# Patient Record
Sex: Male | Born: 1965 | Race: Black or African American | Hispanic: No | Marital: Married | Smoking: Never smoker
Health system: Southern US, Community
[De-identification: ages and names within clinical notes are randomized; demographics above are authoritative.]

## PROBLEM LIST (undated history)

## (undated) DIAGNOSIS — K219 Gastro-esophageal reflux disease without esophagitis: Secondary | ICD-10-CM

## (undated) DIAGNOSIS — B1910 Unspecified viral hepatitis B without hepatic coma: Secondary | ICD-10-CM

---

## 1998-01-07 ENCOUNTER — Emergency Department (HOSPITAL_COMMUNITY): Admission: EM | Admit: 1998-01-07 | Discharge: 1998-01-07 | Payer: Self-pay | Admitting: Emergency Medicine

## 1998-10-24 ENCOUNTER — Encounter: Payer: Self-pay | Admitting: Emergency Medicine

## 1998-10-24 ENCOUNTER — Emergency Department (HOSPITAL_COMMUNITY): Admission: EM | Admit: 1998-10-24 | Discharge: 1998-10-24 | Payer: Self-pay | Admitting: Emergency Medicine

## 1998-11-13 ENCOUNTER — Encounter: Payer: Self-pay | Admitting: Emergency Medicine

## 1998-11-13 ENCOUNTER — Emergency Department (HOSPITAL_COMMUNITY): Admission: EM | Admit: 1998-11-13 | Discharge: 1998-11-13 | Payer: Self-pay | Admitting: Emergency Medicine

## 1998-12-22 ENCOUNTER — Encounter: Admission: RE | Admit: 1998-12-22 | Discharge: 1998-12-22 | Payer: Self-pay | Admitting: Internal Medicine

## 1999-01-27 ENCOUNTER — Emergency Department (HOSPITAL_COMMUNITY): Admission: EM | Admit: 1999-01-27 | Discharge: 1999-01-27 | Payer: Self-pay | Admitting: Emergency Medicine

## 1999-08-22 ENCOUNTER — Emergency Department (HOSPITAL_COMMUNITY): Admission: EM | Admit: 1999-08-22 | Discharge: 1999-08-22 | Payer: Self-pay | Admitting: Emergency Medicine

## 1999-08-22 ENCOUNTER — Encounter: Payer: Self-pay | Admitting: Emergency Medicine

## 2002-02-27 ENCOUNTER — Emergency Department (HOSPITAL_COMMUNITY): Admission: EM | Admit: 2002-02-27 | Discharge: 2002-02-28 | Payer: Self-pay | Admitting: Emergency Medicine

## 2002-02-28 ENCOUNTER — Encounter: Payer: Self-pay | Admitting: Emergency Medicine

## 2002-11-25 ENCOUNTER — Encounter: Payer: Self-pay | Admitting: Emergency Medicine

## 2002-11-25 ENCOUNTER — Emergency Department (HOSPITAL_COMMUNITY): Admission: EM | Admit: 2002-11-25 | Discharge: 2002-11-25 | Payer: Self-pay | Admitting: Emergency Medicine

## 2005-12-06 ENCOUNTER — Ambulatory Visit: Payer: Self-pay | Admitting: Family Medicine

## 2006-01-19 ENCOUNTER — Ambulatory Visit: Payer: Self-pay | Admitting: Family Medicine

## 2006-01-20 ENCOUNTER — Ambulatory Visit: Payer: Self-pay | Admitting: Family Medicine

## 2006-05-04 ENCOUNTER — Ambulatory Visit: Payer: Self-pay | Admitting: Family Medicine

## 2006-05-06 ENCOUNTER — Ambulatory Visit: Payer: Self-pay | Admitting: Family Medicine

## 2006-07-08 ENCOUNTER — Ambulatory Visit: Payer: Self-pay | Admitting: Internal Medicine

## 2006-09-08 ENCOUNTER — Ambulatory Visit: Payer: Self-pay | Admitting: Gastroenterology

## 2008-11-04 ENCOUNTER — Emergency Department (HOSPITAL_COMMUNITY): Admission: EM | Admit: 2008-11-04 | Discharge: 2008-11-04 | Payer: Self-pay | Admitting: Emergency Medicine

## 2009-05-14 ENCOUNTER — Emergency Department (HOSPITAL_COMMUNITY): Admission: EM | Admit: 2009-05-14 | Discharge: 2009-05-14 | Payer: Self-pay | Admitting: Emergency Medicine

## 2009-05-28 ENCOUNTER — Emergency Department (HOSPITAL_COMMUNITY): Admission: EM | Admit: 2009-05-28 | Discharge: 2009-05-28 | Payer: Self-pay | Admitting: Emergency Medicine

## 2009-08-04 ENCOUNTER — Emergency Department (HOSPITAL_COMMUNITY): Admission: EM | Admit: 2009-08-04 | Discharge: 2009-08-04 | Payer: Self-pay | Admitting: Family Medicine

## 2009-09-24 ENCOUNTER — Ambulatory Visit: Payer: Self-pay | Admitting: Internal Medicine

## 2009-10-13 ENCOUNTER — Ambulatory Visit (HOSPITAL_COMMUNITY): Admission: RE | Admit: 2009-10-13 | Discharge: 2009-10-13 | Payer: Self-pay | Admitting: Surgery

## 2009-12-09 ENCOUNTER — Ambulatory Visit: Payer: Self-pay | Admitting: Family Medicine

## 2009-12-09 LAB — CONVERTED CEMR LAB
Cholesterol: 146 mg/dL (ref 0–200)
HDL: 33 mg/dL — ABNORMAL LOW (ref >39)
LDL Cholesterol: 98 mg/dL (ref 0–99)
Total CHOL/HDL Ratio: 4.4
Triglycerides: 74 mg/dL (ref <150)
VLDL: 15 mg/dL (ref 0–40)

## 2009-12-19 ENCOUNTER — Emergency Department (HOSPITAL_COMMUNITY): Admission: EM | Admit: 2009-12-19 | Discharge: 2009-12-19 | Payer: Self-pay | Admitting: Family Medicine

## 2010-01-20 ENCOUNTER — Ambulatory Visit: Payer: Self-pay | Admitting: Internal Medicine

## 2010-03-06 ENCOUNTER — Emergency Department (HOSPITAL_COMMUNITY): Admission: EM | Admit: 2010-03-06 | Discharge: 2010-03-06 | Payer: Self-pay | Admitting: Emergency Medicine

## 2010-05-29 ENCOUNTER — Emergency Department (HOSPITAL_COMMUNITY): Admission: EM | Admit: 2010-05-29 | Discharge: 2010-05-29 | Payer: Self-pay | Admitting: Emergency Medicine

## 2010-06-03 ENCOUNTER — Ambulatory Visit: Payer: Self-pay | Admitting: Family Medicine

## 2010-11-05 ENCOUNTER — Inpatient Hospital Stay (INDEPENDENT_AMBULATORY_CARE_PROVIDER_SITE_OTHER)
Admission: RE | Admit: 2010-11-05 | Discharge: 2010-11-05 | Disposition: A | Discharge status: Home / Self Care | Payer: Self-pay | Source: Ambulatory Visit | Attending: Family Medicine | Admitting: Family Medicine

## 2010-11-05 DIAGNOSIS — N478 Other disorders of prepuce: Secondary | ICD-10-CM

## 2010-12-10 LAB — POCT URINALYSIS DIPSTICK
Hgb urine dipstick: NEGATIVE
Protein, ur: NEGATIVE mg/dL
Specific Gravity, Urine: 1.01 (ref 1.005–1.030)
Urobilinogen, UA: 0.2 mg/dL (ref 0.0–1.0)
pH: 7 (ref 5.0–8.0)

## 2010-12-10 LAB — POCT I-STAT, CHEM 8
BUN: 5 mg/dL — ABNORMAL LOW (ref 6–23)
Calcium, Ion: 1.02 mmol/L — ABNORMAL LOW (ref 1.12–1.32)
Chloride: 109 mEq/L (ref 96–112)
Creatinine, Ser: 1.1 mg/dL (ref 0.4–1.5)
Glucose, Bld: 91 mg/dL (ref 70–99)
HCT: 46 % (ref 39.0–52.0)

## 2010-12-10 LAB — CBC
HCT: 43.1 % (ref 39.0–52.0)
MCHC: 35.3 g/dL (ref 30.0–36.0)
MCV: 83 fL (ref 78.0–100.0)
RDW: 13.1 % (ref 11.5–15.5)
WBC: 5.5 10*3/uL (ref 4.0–10.5)

## 2010-12-10 LAB — DIFFERENTIAL
Basophils Absolute: 0 10*3/uL (ref 0.0–0.1)
Lymphs Abs: 1.4 10*3/uL (ref 0.7–4.0)
Monocytes Relative: 17 % — ABNORMAL HIGH (ref 3–12)
Neutro Abs: 3.1 10*3/uL (ref 1.7–7.7)

## 2011-01-01 LAB — CULTURE, ROUTINE-ABSCESS

## 2011-02-12 NOTE — Assessment & Plan Note (Signed)
[facility]                         GASTROENTEROLOGY OFFICE NOTE   THEDORE, PICKEL                         MRN:          [id number]  DATE:09/08/2006                            DOB:          01-21-1966    REFERRING PHYSICIAN:  Dr. Loreen Freud.   REASON FOR REFERRAL:  Rectal bleeding and reflux symptoms.   HISTORY OF PRESENT ILLNESS:  Kurt Ross is a 45 year old African male whom  I have seen in the past.  He is originally from [place] and moved to the  [place] about 8 years ago. There is some language barrier which interfers  with historical and symptom details. He has a history of GERD with cough  and reactive airway disease.  He underwent upper endoscopy with an  empiric Maloney dilation in January of 2001 that showed only grade I  distal esophagitis.  He has been evaluated by Dr. Jayme Cloud for his cough  and reactive airway disease.  He sees Dr. Loreen Freud as his primary  physician.  He relates ongoing problems with belching, regurgitation,  and heartburn.  He has occasional problems with mild constipation and  notes small amounts of rectal bleeding and a small external lesion on  his rectum for over one year.  He was seen at [facility] in August of  2006 and per records no rectal lesions were noted, but Hemoccult  positive stool was noted.  He was recommended to have GI F/U at that  time. He states he has subsequently seen Dr. Laury Axon, who has noted an  external hemorrhoid.  A positive Helicobacter pylori antibody was  obtained in August of 2006, and was treated with Helidac for 14 days.  Liver function tests and a CBC were normal in August of 2006.  He notes  no change in stool caliber, diarrhea, weight loss, abdominal pain,  nausea or vomiting.  He does note a history of swallowing difficulties  in the past, but none in the past few years.  He is not currently taking  any acid suppressants or any other gastrointestinal medications.   PAST MEDICAL  HISTORY:  GERD, rhinitis, reactive airway disease,  arthritis, depression.   CURRENT MEDICATIONS:  Flonase nasal spray daily.   MEDICATION ALLERGIES:  None known.   SOCIAL HISTORY AND REVIEW OF SYSTEMS:  Per the hand-written form.   PHYSICAL EXAM:  In no acute distress.  Weight 221 pounds, blood pressure 104/70, pulse 88 and regular.  HEENT:  Anicteric sclerae, oropharynx clear.  CHEST:  Clear to auscultation bilaterally.  CARDIAC:  Regular rate and rhythm without murmurs appreciated.  ABDOMEN:  Soft and nontender.  Nondistended.  Normoactive bowel sounds.  No palpable organomegaly, masses, or hernias.  RECTAL:  Deferred to the time of colonoscopy.  EXTREMITIES:  Without cyanosis, clubbing, or edema.  NEUROLOGIC:  Alert and oriented x3.  Grossly nonfocal.   ASSESSMENT AND PLAN:  1. Chronic gastroesophageal reflux disease with a history of reactive      airway disease.  Begin omeprazole 20 mg p.o. q. a.m. along with      standard antireflux measures for long-term  usage.  2. Hemoccult positive stool, intermittent small volume hematochezia,      presumed ext. hemorrhoids and intermittent constipation.  Risks,      benefits, and alternatives to colonoscopy with possible biopsy,      possible polypectomy, and possible destruction of internal      hemorrhoids are discussed with the patient.  He consents to proceed      and this will be scheduled electively.     Venita Lick. Russella Dar, MD, [facility]  Electronically Signed    MTS/MedQ  DD: 09/08/2006  DT: 09/08/2006  Job #: [id number]   cc:   Loreen Freud, M.D.

## 2012-02-25 ENCOUNTER — Ambulatory Visit: Payer: Self-pay | Admitting: Family Medicine

## 2012-02-25 VITALS — BP 126/72 | HR 64 | Temp 98.3°F | Resp 16 | Ht 71.0 in | Wt 215.2 lb

## 2012-02-25 DIAGNOSIS — K219 Gastro-esophageal reflux disease without esophagitis: Secondary | ICD-10-CM

## 2012-02-25 DIAGNOSIS — R0602 Shortness of breath: Secondary | ICD-10-CM

## 2012-02-25 DIAGNOSIS — R059 Cough, unspecified: Secondary | ICD-10-CM

## 2012-02-25 DIAGNOSIS — R197 Diarrhea, unspecified: Secondary | ICD-10-CM

## 2012-02-25 DIAGNOSIS — R05 Cough: Secondary | ICD-10-CM

## 2012-02-25 LAB — POCT CBC
HCT, POC: 46.6 % (ref 43.5–53.7)
Hemoglobin: 15.1 g/dL (ref 14.1–18.1)
Lymph, poc: 1.7 (ref 0.6–3.4)
MCHC: 32.4 g/dL (ref 31.8–35.4)
MCV: 87.4 fL (ref 80–97)
POC LYMPH PERCENT: 46.3 %L (ref 10–50)
RDW, POC: 14.4 %
WBC: 3.7 10*3/uL — AB (ref 4.6–10.2)

## 2012-02-25 MED ORDER — METOCLOPRAMIDE HCL 5 MG PO TABS: 5.0000 mg | ORAL_TABLET | Freq: Four times a day (QID) | ORAL | Status: DC

## 2012-02-25 MED ORDER — OMEPRAZOLE 40 MG PO CPDR: 40.0000 mg | DELAYED_RELEASE_CAPSULE | Freq: Every day | ORAL | Status: DC

## 2012-02-25 NOTE — Progress Notes (Signed)
Subjective: 46 year old African male from [place]. He has a long history of reflux over the last 20 years. He has tried numerous medications without relief. He has been evaluated by gastroenterologist a long time ago. He does have concerns because his continues on so much. 2 days ago he developed a couple of episodes of diarrhea. Then he developed some throat discomfort and reflux. The reflux is been worse with the burping a lot. He got coughing and getting short of breath last night. He called the ambulance, but they assessed him and did not find anything of major concern at that time, feeling like he is from the drainage and and reflux. He has felt like he might have a little fever yesterday. The diarrhea has not persisted. No one else in the family has been acutely ill recently. 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  Objective: No major distress but he does not look like he feels real well. He is TMs are normal though partially occluded by cerumen. His throat was clear. Neck supple without significant nodes. Chest is clear to auscultation. Heart regular without murmurs gallops or arrhythmias. Abdomen soft without masses or tenderness.  Assessment: GERD Diarrhea Cough and shortness of breath  Plan: Check CBC and H. Pylori. Results for orders placed in visit on 02/25/12  POCT CBC      Component Value Range   WBC 3.7 (*) 4.6 - 10.2 (K/uL)   Lymph, poc 1.7  0.6 - 3.4    POC LYMPH PERCENT 46.3  10 - 50 (%L)   MID (cbc) 0.5  0 - 0.9    POC MID % 12.4 (*) 0 - 12 (%M)   POC Granulocyte 1.5 (*) 2 - 6.9    Granulocyte percent 41.3  37 - 80 (%G)   RBC 5.33  4.69 - 6.13 (M/uL)   Hemoglobin 15.1  14.1 - 18.1 (g/dL)   HCT, POC 16.1  09.6 - 53.7 (%)   MCV 87.4  80 - 97 (fL)   MCH, POC 28.3  27 - 31.2 (pg)   MCHC 32.4  31.8 - 35.4 (g/dL)   RDW, POC 04.5     Platelet Count, POC 221  142 - 424 (K/uL)   MPV 9.3  0 - 99.8 (fL)   Will treat with Reglan because he is so nauseated and omeprazole. If he  keeps having problems we will have her referred to a gastroenterologist, but we talked about the fact he does not have any insurance.

## 2012-02-25 NOTE — Patient Instructions (Signed)

## 2012-02-29 ENCOUNTER — Telehealth: Payer: Self-pay

## 2012-02-29 ENCOUNTER — Encounter: Payer: Self-pay | Admitting: Family Medicine

## 2012-02-29 NOTE — Telephone Encounter (Signed)
Has developed a rash and cannot sleep from the medication [phone]

## 2012-03-02 NOTE — Telephone Encounter (Signed)
Pt reports that the rash was only once and it went away even w/continuing the meds. He does not think it was related, but he is having trouble staying awake at night w/the Reglan - he gets sleepy and goes to sleep but wakes up after two hrs and can't go back to sleep. Advised pt that he can try DCing the Reglan since Dr Alwyn Ren only Rxd it for a short time, and CB if his Sxs worsen again. Pt asked if he can take Benedryl at night with the other meds. After checking w/Alicia, told pt that the Reglan and Benedryl together may just make him extra drowsy/sleepy and it would be best to take the Reglan about 4 hrs earlier than the Benedryl instead of together. Pt agreed.

## 2012-03-02 NOTE — Telephone Encounter (Signed)
This is Dr. Frederik Pear pt but  was in [facility]'s in basket.  Please have pt stop meds and RTC to see DR. Alwyn Ren

## 2012-10-23 ENCOUNTER — Emergency Department (HOSPITAL_COMMUNITY)
Admission: EM | Admit: 2012-10-23 | Discharge: 2012-10-23 | Disposition: A | Discharge status: Home / Self Care | Payer: No Typology Code available for payment source | Source: Home / Self Care | Attending: Family Medicine | Admitting: Family Medicine

## 2012-10-23 ENCOUNTER — Other Ambulatory Visit (HOSPITAL_COMMUNITY)
Admission: RE | Admit: 2012-10-23 | Discharge: 2012-10-23 | Disposition: A | Discharge status: Home / Self Care | Payer: No Typology Code available for payment source | Source: Ambulatory Visit | Attending: Family Medicine | Admitting: Family Medicine

## 2012-10-23 ENCOUNTER — Encounter (HOSPITAL_COMMUNITY): Payer: Self-pay | Admitting: Emergency Medicine

## 2012-10-23 DIAGNOSIS — Z113 Encounter for screening for infections with a predominantly sexual mode of transmission: Secondary | ICD-10-CM | POA: Insufficient documentation

## 2012-10-23 DIAGNOSIS — I861 Scrotal varices: Secondary | ICD-10-CM

## 2012-10-23 DIAGNOSIS — R351 Nocturia: Secondary | ICD-10-CM

## 2012-10-23 HISTORY — DX: Gastro-esophageal reflux disease without esophagitis: K21.9

## 2012-10-23 LAB — POCT URINALYSIS DIP (DEVICE)
Hgb urine dipstick: NEGATIVE
Ketones, ur: NEGATIVE mg/dL
Protein, ur: NEGATIVE mg/dL
Specific Gravity, Urine: 1.015 (ref 1.005–1.030)
Urobilinogen, UA: 0.2 mg/dL (ref 0.0–1.0)

## 2012-10-23 MED ORDER — TAMSULOSIN HCL 0.4 MG PO CAPS: 0.4000 mg | ORAL_CAPSULE | Freq: Every day | ORAL | Status: DC

## 2012-10-23 MED ORDER — DOXYCYCLINE HYCLATE 100 MG PO CAPS: 100.0000 mg | ORAL_CAPSULE | Freq: Two times a day (BID) | ORAL | Status: DC

## 2012-10-23 NOTE — ED Provider Notes (Signed)
History     CSN: [id number]  Arrival date & time 10/23/12  1819   First MD Initiated Contact with Patient 10/23/12 2035      Chief Complaint  Patient presents with  . Prostate Cancer    (Consider location/radiation/quality/duration/timing/severity/associated sxs/prior treatment) The history is provided by the patient.  47 y.o. male complains of bloody semen for three months.  States history of same three years ago for which he had a urological exam with no abnormalities.  States he was seen by primary care provider this month and was told that if symptoms did not improve he would ultrasound and further evaluation.  Complains of dysuria, frequent urination and nocturnal frequency.  Additionally reports problems with remaining erect with sexual intercourse.  Denies penile discharge, no concern for STD but expresses concern for prostate cancer since his father was diagnosed with prostatic "problems" at age of 74.  Denies significant pelvic pain or fever. Last unprotected intercourse 1 month ago.  Denies history of known exposure to STD or symptoms in partner.   Past Medical History  Diagnosis Date  . GERD (gastroesophageal reflux disease)     History reviewed. No pertinent past surgical history.  No family history on file.  History  Substance Use Topics  . Smoking status: Never Smoker   . Smokeless tobacco: Not on file  . Alcohol Use: No      Review of Systems  Genitourinary: Positive for dysuria, frequency and scrotal swelling.  All other systems reviewed and are negative.    Allergies  Codeine  Home Medications   Current Outpatient Rx  Name  Route  Sig  Dispense  Refill  . DOXYCYCLINE HYCLATE 100 MG PO CAPS   Oral   Take 1 capsule (100 mg total) by mouth 2 (two) times daily.   20 capsule   0   . METOCLOPRAMIDE HCL 5 MG PO TABS   Oral   Take 1 tablet (5 mg total) by mouth 4 (four) times daily.   40 tablet   0   . OMEPRAZOLE 40 MG PO CPDR   Oral   Take  1 capsule (40 mg total) by mouth daily.   30 capsule   3   . TAMSULOSIN HCL 0.4 MG PO CAPS   Oral   Take 1 capsule (0.4 mg total) by mouth daily after supper.   30 capsule   0     BP 138/80  Pulse 62  Temp 98.5 F (36.9 C) (Oral)  Resp 16  SpO2 98%  Physical Exam  Nursing note and vitals reviewed. Constitutional: He is oriented to person, place, and time. Vital signs are normal. He appears well-developed and well-nourished. He is active and cooperative.  HENT:  Head: Normocephalic.  Eyes: Conjunctivae normal are normal. Pupils are equal, round, and reactive to light. No scleral icterus.  Neck: Trachea normal. Neck supple.  Cardiovascular: Normal rate and regular rhythm.   Pulmonary/Chest: Effort normal and breath sounds normal.  Abdominal: Soft. Bowel sounds are normal. There is no tenderness. There is no rebound and no guarding. Hernia confirmed negative in the right inguinal area and confirmed negative in the left inguinal area.  Genitourinary: Rectum normal, prostate normal and penis normal. Cremasteric reflex is present. Right testis shows swelling. Right testis shows no mass and no tenderness. Right testis is descended. Cremasteric reflex is not absent on the right side. Left testis shows no mass, no swelling and no tenderness. Left testis is descended. Cremasteric reflex is not absent  on the left side. Circumcised.       Left varicocele  Lymphadenopathy:       Right: No inguinal adenopathy present.       Left: No inguinal adenopathy present.  Neurological: He is alert and oriented to person, place, and time. No cranial nerve deficit or sensory deficit.  Skin: Skin is warm and dry.  Psychiatric: He has a normal mood and affect. His speech is normal and behavior is normal. Judgment and thought content normal. Cognition and memory are normal.    ED Course  Procedures (including critical care time)   Labs Reviewed  POCT URINALYSIS DIP (DEVICE)  URINE CYTOLOGY ANCILLARY  ONLY  URINE CULTURE   No results found.   1. Left varicocele   2. Nocturnal polyuria       MDM  Symptoms associated with what I believe to be prostatic hypertrophy although not effectively palpated on rectal exam.  Varicocele found on exam, nontender to palpation.  Will await urine culture, STD screen.  Doxycycline prescribed prophylaxis.  Pt referred to urologist.        Johnsie Kindred, NP 10/23/12 2156

## 2012-10-23 NOTE — ED Notes (Addendum)
Reports blood in semen, denies blood in urine.  Has had episodes for three years, this episode has been 2 months .  Has seen specialist, but no diagnosis per patient.  Episode resolves on its own.  This episode has not cleared like previous episodes.  Patient is concerned for prostate cancer

## 2012-10-24 NOTE — ED Provider Notes (Signed)
Medical screening examination/treatment/procedure(s) were performed by resident physician or non-physician practitioner and as supervising physician I was immediately available for consultation/collaboration.   Barkley Bruns MD.    Linna Hoff, MD 10/24/12 Zollie Pee

## 2012-11-06 ENCOUNTER — Emergency Department (HOSPITAL_COMMUNITY)
Admission: EM | Admit: 2012-11-06 | Discharge: 2012-11-06 | Disposition: A | Discharge status: Home / Self Care | Payer: No Typology Code available for payment source | Source: Home / Self Care | Attending: Family Medicine | Admitting: Family Medicine

## 2012-11-06 ENCOUNTER — Encounter (HOSPITAL_COMMUNITY): Payer: Self-pay

## 2012-11-06 DIAGNOSIS — I861 Scrotal varices: Secondary | ICD-10-CM

## 2012-11-06 DIAGNOSIS — R361 Hematospermia: Secondary | ICD-10-CM

## 2012-11-06 DIAGNOSIS — N400 Enlarged prostate without lower urinary tract symptoms: Secondary | ICD-10-CM

## 2012-11-06 NOTE — ED Notes (Signed)
Patient states was seen  By urgent care            Dx- varicocele and needs a referral to [place], Lowella Petties, MD [address] [place] [address]  [place], [place] [address]

## 2012-11-06 NOTE — ED Provider Notes (Signed)
History     CSN: [id number]  Arrival date & time 11/06/12  1655   First MD Initiated Contact with Patient 11/06/12 1701      Chief Complaint  Patient presents with  . Follow-up   HPI Pt is presenting today to request a referral to a urologist for evaluation of blood in semen that has been present for about 3 months.  He says that he had this problem before and he has seen a urologist in the past but it went away.  For some reason, the problem has returned and it has not gone away.  The patient reports that he insists that he see a urologist.  No pain with ejaculation.  He says occasionally he has burning with urination.  He was recently seen and started on doxycycline by the urgent care provider.  No significant improvements noted.  Pt says that he was placed on an antibiotic doxycycline when he was seen at urgent care recently but no improvement in symptoms.  He was diagnosed with a left varicocele at that visit.  He was also diagnosed with mild BPH.   Past Medical History  Diagnosis Date  . GERD (gastroesophageal reflux disease)     History reviewed. No pertinent past surgical history.  No family history on file.  History  Substance Use Topics  . Smoking status: Never Smoker   . Smokeless tobacco: Not on file  . Alcohol Use: No    Review of Systems  Genitourinary: Positive for dysuria, urgency and frequency.       Blood in semen   All other systems reviewed and are negative.    Allergies  Codeine  Home Medications   Current Outpatient Rx  Name  Route  Sig  Dispense  Refill  . doxycycline (VIBRAMYCIN) 100 MG capsule   Oral   Take 1 capsule (100 mg total) by mouth 2 (two) times daily.   20 capsule   0   . EXPIRED: metoCLOPramide (REGLAN) 5 MG tablet   Oral   Take 1 tablet (5 mg total) by mouth 4 (four) times daily.   40 tablet   0   . omeprazole (PRILOSEC) 40 MG capsule   Oral   Take 1 capsule (40 mg total) by mouth daily.   30 capsule   3   .  Tamsulosin HCl (FLOMAX) 0.4 MG CAPS   Oral   Take 1 capsule (0.4 mg total) by mouth daily after supper.   30 capsule   0     There were no vitals taken for this visit.  Physical Exam  Nursing note and vitals reviewed. Constitutional: He is oriented to person, place, and time. He appears well-developed and well-nourished. No distress.  HENT:  Head: Normocephalic and atraumatic.  Eyes: Conjunctivae and EOM are normal. Pupils are equal, round, and reactive to light.  Neck: Normal range of motion. Neck supple.  Cardiovascular: Normal rate.   Pulmonary/Chest: Effort normal and breath sounds normal.  Musculoskeletal: Normal range of motion. He exhibits no edema and no tenderness.  Neurological: He is alert and oriented to person, place, and time.  Skin: Skin is warm and dry.  Psychiatric: He has a normal mood and affect. His behavior is normal. Judgment and thought content normal.    ED Course  Procedures (including critical care time)  Labs Reviewed - No data to display No results found.   No diagnosis found.  MDM  IMPRESSION  Varicocele, left  Blood in semen  BPH  RECOMMENDATIONS / PLAN Pt was recommended to see Dr. Lowella Petties [place] by the urgent care center.  He has an [employer] discount card.  Will go ahead and make referral to the urologist.    FOLLOW UP 2 months  The patient was given clear instructions to go to ER or return to medical center if symptoms don't improve, worsen or new problems develop.  The patient verbalized understanding.  The patient was told to call to get lab results if they haven't heard anything in the next week.            Cleora Fleet, MD 11/06/12 [phone]

## 2012-11-07 NOTE — ED Notes (Signed)
Referral faxed to urology waiting for appt

## 2012-12-21 NOTE — ED Notes (Signed)
Pt was already seen by specialist- no need for urologic evaluation

## 2013-02-08 ENCOUNTER — Encounter (HOSPITAL_COMMUNITY): Payer: Self-pay

## 2013-02-08 ENCOUNTER — Emergency Department (HOSPITAL_COMMUNITY): Payer: No Typology Code available for payment source

## 2013-02-08 ENCOUNTER — Emergency Department (HOSPITAL_COMMUNITY)
Admission: EM | Admit: 2013-02-08 | Discharge: 2013-02-08 | Disposition: A | Discharge status: Other Acute Inpatient Hospital | Payer: No Typology Code available for payment source | Source: Home / Self Care

## 2013-02-08 ENCOUNTER — Emergency Department (HOSPITAL_COMMUNITY)
Admission: EM | Admit: 2013-02-08 | Discharge: 2013-02-08 | Disposition: A | Discharge status: Home / Self Care | Payer: No Typology Code available for payment source | Attending: Emergency Medicine | Admitting: Emergency Medicine

## 2013-02-08 DIAGNOSIS — R748 Abnormal levels of other serum enzymes: Secondary | ICD-10-CM | POA: Insufficient documentation

## 2013-02-08 DIAGNOSIS — R1011 Right upper quadrant pain: Secondary | ICD-10-CM

## 2013-02-08 DIAGNOSIS — Z880 Allergy status to penicillin: Secondary | ICD-10-CM | POA: Insufficient documentation

## 2013-02-08 DIAGNOSIS — Z79899 Other long term (current) drug therapy: Secondary | ICD-10-CM | POA: Insufficient documentation

## 2013-02-08 DIAGNOSIS — K219 Gastro-esophageal reflux disease without esophagitis: Secondary | ICD-10-CM | POA: Insufficient documentation

## 2013-02-08 DIAGNOSIS — R52 Pain, unspecified: Secondary | ICD-10-CM

## 2013-02-08 LAB — URINALYSIS, ROUTINE W REFLEX MICROSCOPIC
Ketones, ur: NEGATIVE mg/dL
Leukocytes, UA: NEGATIVE
Nitrite: NEGATIVE
Protein, ur: NEGATIVE mg/dL
Urobilinogen, UA: 0.2 mg/dL (ref 0.0–1.0)

## 2013-02-08 LAB — COMPREHENSIVE METABOLIC PANEL
ALT: 82 U/L — ABNORMAL HIGH (ref 0–53)
CO2: 30 mEq/L (ref 19–32)
Calcium: 9.4 mg/dL (ref 8.4–10.5)
Creatinine, Ser: 0.99 mg/dL (ref 0.50–1.35)
GFR calc Af Amer: >90 mL/min (ref >90)
GFR calc non Af Amer: >90 mL/min (ref >90)
Glucose, Bld: 84 mg/dL (ref 70–99)
Sodium: 139 mEq/L (ref 135–145)
Total Protein: 8.2 g/dL (ref 6.0–8.3)

## 2013-02-08 LAB — CBC WITH DIFFERENTIAL/PLATELET
Eosinophils Absolute: 0.1 10*3/uL (ref 0.0–0.7)
Eosinophils Relative: 3 % (ref 0–5)
HCT: 46.9 % (ref 39.0–52.0)
Lymphs Abs: 2 10*3/uL (ref 0.7–4.0)
MCH: 30.2 pg (ref 26.0–34.0)
MCV: 83.5 fL (ref 78.0–100.0)
Monocytes Absolute: 0.6 10*3/uL (ref 0.1–1.0)
Platelets: 184 10*3/uL (ref 150–400)
RBC: 5.62 MIL/uL (ref 4.22–5.81)
RDW: 13.2 % (ref 11.5–15.5)

## 2013-02-08 LAB — LIPASE, BLOOD: Lipase: 31 U/L (ref 11–59)

## 2013-02-08 LAB — AMYLASE: Amylase: 80 U/L (ref 0–105)

## 2013-02-08 MED ORDER — TRAMADOL HCL 50 MG PO TABS: 50.0000 mg | ORAL_TABLET | Freq: Four times a day (QID) | ORAL | Status: DC | PRN

## 2013-02-08 MED ORDER — DEXLANSOPRAZOLE 30 MG PO CPDR: 30.0000 mg | DELAYED_RELEASE_CAPSULE | Freq: Every day | ORAL | Status: DC

## 2013-02-08 NOTE — ED Notes (Signed)
Pt. Having rt. Upper abdominal pain, Urgent care sent pt. To [place] for an US of the abdomen.  Pt. Denies any n/v/d

## 2013-02-08 NOTE — ED Provider Notes (Addendum)
History     CSN: [id number]  Arrival date & time 02/08/13  1340   First MD Initiated Contact with Patient 02/08/13 1500      Chief Complaint  Patient presents with  . Abdominal Pain    (Consider location/radiation/quality/duration/timing/severity/associated sxs/prior treatment) Patient is a 47 y.o. male presenting with abdominal pain.  Abdominal Pain Associated symptoms: constipation and nausea     Patient is a 47 year old [place] American male with history of GERD, has been on a PPI in the past but none now. Per patient he had EGD in the past which had shown PUD he was on H. pylori treatment and PPI, then he did not followup with GI. This morning after he ate his breakfast, developed sharp, right upper quadrant pain, 10/10, intermittent which spontaneously resolved in 1and half hour. At the time of my examination, patient had no pain. He denies any prior episodes of such pain.  Patient states that he sometimes gets abdominal pain when he is constipated however he had a bowel movement today morning.   Past Medical History  Diagnosis Date  . GERD (gastroesophageal reflux disease)     History reviewed. No pertinent past surgical history.  History reviewed. No pertinent family history.  History  Substance Use Topics  . Smoking status: Never Smoker   . Smokeless tobacco: Not on file  . Alcohol Use: No      Review of Systems  Constitutional: Negative.   HENT: Negative.   Eyes: Negative.   Respiratory: Negative.   Cardiovascular: Negative.   Gastrointestinal: Positive for nausea, abdominal pain and constipation.  Endocrine: Negative.   Genitourinary: Negative.   Musculoskeletal: Negative.   Skin: Negative.   Neurological: Negative.     Allergies  Codeine  Home Medications   Current Outpatient Rx  Name  Route  Sig  Dispense  Refill  . Dexlansoprazole (DEXILANT) 30 MG capsule   Oral   Take 1 capsule (30 mg total) by mouth daily.   30 capsule   3   .  doxycycline (VIBRAMYCIN) 100 MG capsule   Oral   Take 1 capsule (100 mg total) by mouth 2 (two) times daily.   20 capsule   0   . EXPIRED: metoCLOPramide (REGLAN) 5 MG tablet   Oral   Take 1 tablet (5 mg total) by mouth 4 (four) times daily.   40 tablet   0   . omeprazole (PRILOSEC) 40 MG capsule   Oral   Take 1 capsule (40 mg total) by mouth daily.   30 capsule   3   . Tamsulosin HCl (FLOMAX) 0.4 MG CAPS   Oral   Take 1 capsule (0.4 mg total) by mouth daily after supper.   30 capsule   0   . traMADol (ULTRAM) 50 MG tablet   Oral   Take 1 tablet (50 mg total) by mouth every 6 (six) hours as needed for pain.   30 tablet   3     BP 114/68  Pulse 61  Temp(Src) 97.6 F (36.4 C) (Oral)  Resp 16  SpO2 100%  Physical Exam  Constitutional: He is oriented to person, place, and time. He appears well-developed and well-nourished.  HENT:  Head: Normocephalic and atraumatic.  Eyes: Pupils are equal, round, and reactive to light.  Cardiovascular: Normal rate and regular rhythm.   Pulmonary/Chest: Effort normal and breath sounds normal.  Abdominal: Soft. Bowel sounds are normal. There is no tenderness. There is no rebound and no guarding.  Neurological: He is alert and oriented to person, place, and time.  Skin: Skin is warm and dry.  Psychiatric: He has a normal mood and affect. His behavior is normal.    ED Course  Procedures (including critical care time)  Labs Reviewed - No data to display No results found.   1. Abdominal pain, acute, right upper quadrant   2. GERD (gastroesophageal reflux disease)       MDM  1)  Abdominal pain- currently resolved, started after eating breakfast - Patient advised to eat fat free diet, n.p.o. after midnight for the ultrasound at 7 AM - Abdominal US ordered for tomorrow a.m. to rule out any cholelithiasis/acute cholecystitis/biliary disease -Will followup results with the patient in am, gave prescription for tramadol PRN for  pain  2) GERD - Given prescription forDexilant  Taneil Lazarus M.D. [facility] 02/08/2013, 3:41 PM  Pager: [phone]   Addendum The patient again started having severe right upper quadrant abdominal pain while still in the room awaiting disposition. On examination appears to be have possibly biliary colic intermittent pain. No nausea, vomiting or fevers. However at this point he needs to have further workup. He denies any alcohol use. Will transfer him to the [facility] emergency room for further workup, abdominal ultrasound, LFTs, lipase. Discussed with the patient in detail, he is agreeable with the current plan.   Normajean Nash M.D. [facility] 02/08/2013, 4:02 PM  Pager: [phone]       Cathren Harsh, MD 02/08/13 [phone]

## 2013-02-08 NOTE — ED Notes (Signed)
Reports onset today of pain right upper abdominial area , right lower ribs; denies n/d/v, denies dysuria, hematuria; no one else in home ill

## 2013-02-08 NOTE — ED Notes (Signed)
Patient has had recurrence of previous pain ; MD advised

## 2013-02-08 NOTE — ED Notes (Signed)
Pt transported to US

## 2013-02-08 NOTE — ED Provider Notes (Signed)
History     CSN: [id number]  Arrival date & time 02/08/13  1613   First MD Initiated Contact with Patient 02/08/13 1809      Chief Complaint  Patient presents with  . Abdominal Pain    (Consider location/radiation/quality/duration/timing/severity/associated sxs/prior treatment) HPI Comments: Patient presents to the ED for intermittent RUQ pain.  Pain described as sharp, non-radiating and is not associated with nausea or vomiting.  Pain is not exacerbated by intake of greasy or spicy foods, however he notes that this causes his GERD sx to worsen.  Notes if he does not eat for several hours, he tends to get a bloating sensation throughout his abdomen.  Patient seen by his PCP earlier today for the same, sent to the ED for labs and abdominal ultrasound.  Denies any nausea, vomiting, diarrhea, fever, sweats or chills. Bowel movements normal including one earlier today. Denies blood in his stool.   Patient currently denies any pain.  The history is provided by the patient.    Past Medical History  Diagnosis Date  . GERD (gastroesophageal reflux disease)     History reviewed. No pertinent past surgical history.  No family history on file.  History  Substance Use Topics  . Smoking status: Never Smoker   . Smokeless tobacco: Not on file  . Alcohol Use: No      Review of Systems  Gastrointestinal: Positive for abdominal pain.  All other systems reviewed and are negative.    Allergies  Codeine and Penicillins  Home Medications   Current Outpatient Rx  Name  Route  Sig  Dispense  Refill  . OVER THE COUNTER MEDICATION   Oral   Take 2 tablets by mouth every evening. Arthritis medicine         . Dexlansoprazole (DEXILANT) 30 MG capsule   Oral   Take 1 capsule (30 mg total) by mouth daily.   30 capsule   3   . traMADol (ULTRAM) 50 MG tablet   Oral   Take 1 tablet (50 mg total) by mouth every 6 (six) hours as needed for pain.   30 tablet   3     BP 146/85   Pulse 58  Temp(Src) 97.9 F (36.6 C) (Oral)  Resp 20  SpO2 100%  Physical Exam  Nursing note and vitals reviewed. Constitutional: He is oriented to person, place, and time. He appears well-developed and well-nourished.  HENT:  Head: Normocephalic and atraumatic.  Mouth/Throat: Oropharynx is clear and moist.  Eyes: Conjunctivae and EOM are normal. Pupils are equal, round, and reactive to light.  Neck: Normal range of motion.  Cardiovascular: Normal rate, regular rhythm and normal heart sounds.   Pulmonary/Chest: Effort normal and breath sounds normal.  Abdominal: Soft. Bowel sounds are normal. There is tenderness in the right upper quadrant. There is no CVA tenderness and no tenderness at McBurney's point.  Mild RUQ tenderness, no true Murphy's sign  Musculoskeletal: Normal range of motion.  Neurological: He is alert and oriented to person, place, and time.  Skin: Skin is warm and dry.  Psychiatric: He has a normal mood and affect.    ED Course  Procedures (including critical care time)  Labs Reviewed  CBC WITH DIFFERENTIAL - Abnormal; Notable for the following:    MCHC 36.2 (*)    Neutrophils Relative % 33 (*)    Neutro Abs 1.4 (*)    Lymphocytes Relative 49 (*)    Monocytes Relative 15 (*)    All other  components within normal limits  COMPREHENSIVE METABOLIC PANEL - Abnormal; Notable for the following:    AST 58 (*)    ALT 82 (*)    All other components within normal limits  AMYLASE  LIPASE, BLOOD   US Abdomen Complete  02/08/2013   *RADIOLOGY REPORT*  Clinical Data:  Right upper quadrant pain.  COMPLETE ABDOMINAL ULTRASOUND  Comparison:  None.  Findings:  Gallbladder:  No gallstones, gallbladder wall thickening, or pericholecystic fluid.  Common bile duct:  Measures 0.2 cm.  Liver:  No focal lesion identified.  Within normal limits in parenchymal echogenicity.  IVC:  Appears normal.  Pancreas:  No focal abnormality seen.  Spleen:  Measures 5.9 cm and appears normal.   Right Kidney:  Measures 9.9 cm and appears normal.  Left Kidney:  Measures 12.7 cm.  No stones or hydronephrosis. A 5.8 cm cyst off the lower pole is incidentally noted.  Abdominal aorta:  No aneurysm identified.  IMPRESSION: Negative for gallstones.  No acute finding.  Left renal cyst noted.   Original Report Authenticated By: Holley Dexter, M.D.     1. RUQ pain   2. Elevated liver enzymes       MDM   47 year old male presenting to the ED for right upper quadrant pain, sent from PCP office for labs and abd u/s.  No pain currently.  Pt remained asx in the ED.  Labs revealing liver enzyme abnormalities- question of acute vs chronic, no old labs for comparison.  U/s negative for acute hepatobiliary disease.  U/a without evidence of infection.  Liver enzymes will need to be followed.  Pt will follow up with his PCP to have these rechecked in a few weeks- copies given of labs and u/s to discuss with physician.  Discussed plan with pt, he agreed.  Return precautions advised.        Garlon Hatchet, PA-C 02/09/13 1342

## 2013-02-09 ENCOUNTER — Ambulatory Visit (HOSPITAL_COMMUNITY): Admit: 2013-02-09 | Payer: No Typology Code available for payment source

## 2013-02-09 NOTE — ED Provider Notes (Signed)
Medical screening examination/treatment/procedure(s) were conducted as a shared visit with non-physician practitioner(s) and myself.  I personally evaluated the patient during the encounter.   No acute abdomen. Liver functions minimally elevated.  Ultrasound negative for gallbladder disease. Urine negative.  Patinet understands to recheck liver enzymes in the next 2-3 weeks  Donnetta Hutching, MD 02/09/13 1402

## 2013-02-14 ENCOUNTER — Encounter: Payer: Self-pay | Admitting: Internal Medicine

## 2013-02-14 ENCOUNTER — Ambulatory Visit: Payer: No Typology Code available for payment source | Attending: Internal Medicine | Admitting: Internal Medicine

## 2013-02-14 VITALS — BP 103/70 | HR 65 | Temp 98.6°F | Resp 16 | Wt 224.6 lb

## 2013-02-14 DIAGNOSIS — R7401 Elevation of levels of liver transaminase levels: Secondary | ICD-10-CM

## 2013-02-14 DIAGNOSIS — R1011 Right upper quadrant pain: Secondary | ICD-10-CM | POA: Insufficient documentation

## 2013-02-14 LAB — COMPREHENSIVE METABOLIC PANEL
ALT: 111 U/L — ABNORMAL HIGH (ref 0–53)
Alkaline Phosphatase: 68 U/L (ref 39–117)
CO2: 26 mEq/L (ref 19–32)
Creat: 0.79 mg/dL (ref 0.50–1.35)
Sodium: 139 mEq/L (ref 135–145)
Total Bilirubin: 0.4 mg/dL (ref 0.3–1.2)
Total Protein: 7.1 g/dL (ref 6.0–8.3)

## 2013-02-14 NOTE — Progress Notes (Signed)
Patient ID: Kurt Ross, male   DOB: 1966-03-24, 47 y.o.   MRN: [id number]   CC: FOLLOW UP  HPI: Pt is 47 yo male who presents to clinic for follow up on recent hospitalization for right lower and upper quadrant pain. He wants to have repeat liver enzymes checked as he was told on discharge. He denies abdominal or urinary concerns. No fevers, chills, no other systemic concerns.   Allergies  Allergen Reactions  . Codeine Nausea And Vomiting  . Penicillins Rash   Past Medical History  Diagnosis Date  . GERD (gastroesophageal reflux disease)    Current Outpatient Prescriptions on File Prior to Visit  Medication Sig Dispense Refill  . Dexlansoprazole (DEXILANT) 30 MG capsule Take 1 capsule (30 mg total) by mouth daily.  30 capsule  3  . OVER THE COUNTER MEDICATION Take 2 tablets by mouth every evening. Arthritis medicine      . traMADol (ULTRAM) 50 MG tablet Take 1 tablet (50 mg total) by mouth every 6 (six) hours as needed for pain.  30 tablet  3   No current facility-administered medications on file prior to visit.   No family history on file. History   Social History  . Marital Status: Married    Spouse Name: N/A    Number of Children: N/A  . Years of Education: N/A   Occupational History  . Not on file.   Social History Main Topics  . Smoking status: Never Smoker   . Smokeless tobacco: Not on file  . Alcohol Use: No  . Drug Use: No  . Sexually Active: Not on file   Other Topics Concern  . Not on file   Social History Narrative  . No narrative on file    Review of Systems  Constitutional: Negative for fever, chills, diaphoresis, activity change, appetite change and fatigue.  HENT: Negative for ear pain, nosebleeds, congestion, facial swelling, rhinorrhea, neck pain, neck stiffness and ear discharge.   Eyes: Negative for pain, discharge, redness, itching and visual disturbance.  Respiratory: Negative for cough, choking, chest tightness, shortness of breath, wheezing  and stridor.   Cardiovascular: Negative for chest pain, palpitations and leg swelling.  Gastrointestinal: Negative for abdominal distention.  Genitourinary: Negative for dysuria, urgency, frequency, hematuria, flank pain, decreased urine volume, difficulty urinating and dyspareunia.  Musculoskeletal: Negative for back pain, joint swelling, arthralgias and gait problem.  Neurological: Negative for dizziness, tremors, seizures, syncope, facial asymmetry, speech difficulty, weakness, light-headedness, numbness and headaches.  Hematological: Negative for adenopathy. Does not bruise/bleed easily.  Psychiatric/Behavioral: Negative for hallucinations, behavioral problems, confusion, dysphoric mood, decreased concentration and agitation.    Objective:   Filed Vitals:   02/14/13 1315  BP: 103/70  Pulse: 65  Temp: 98.6 F (37 C)  Resp: 16    Physical Exam  Constitutional: Appears well-developed and well-nourished. No distress.  HENT: Normocephalic. External right and left ear normal. Oropharynx is clear and moist.  Eyes: Conjunctivae and EOM are normal. PERRLA, no scleral icterus.  Neck: Normal ROM. Neck supple. No JVD. No tracheal deviation. No thyromegaly.  CVS: RRR, S1/S2 +, no murmurs, no gallops, no carotid bruit.  Pulmonary: Effort and breath sounds normal, no stridor, rhonchi, wheezes, rales.  Abdominal: Soft. BS +,  no distension, tenderness, rebound or guarding.  Neuro: Alert. Normal reflexes, muscle tone coordination. No cranial nerve deficit.  Lab Results  Component Value Date   WBC 4.2 02/08/2013   HGB 17.0 02/08/2013   HCT 46.9 02/08/2013   MCV  83.5 02/08/2013   PLT 184 02/08/2013   Lab Results  Component Value Date   CREATININE 0.99 02/08/2013   BUN 9 02/08/2013   NA 139 02/08/2013   K 3.7 02/08/2013   CL 101 02/08/2013   CO2 30 02/08/2013    No results found for this basename: HGBA1C   Lipid Panel     Component Value Date/Time   CHOL 146 12/09/2009 2153   TRIG 74  12/09/2009 2153   HDL 33* 12/09/2009 2153   CHOLHDL 4.4 Ratio 12/09/2009 2153   VLDL 15 12/09/2009 2153   LDLCALC 98 12/09/2009 2153       Assessment and plan:   Right upper quadrant pain and transaminitis - pain is resolved and pt has no concerns as noted above - will check LFT today to ensure resolution of transaminitis  - will call pt with blood test results

## 2013-02-14 NOTE — Progress Notes (Signed)
Patient is a hospital follow up Was having elevated liver problems

## 2013-02-14 NOTE — Patient Instructions (Signed)
Liver Panel A liver panel, also known as liver (hepatic) function tests or LFT, is used to detect liver damage or disease. One or more of these tests are ordered when symptoms suspicious of a liver condition are noticed. These include: jaundice, dark urine, or light-colored bowel movements; nausea, vomiting and/or diarrhea; loss of appetite; vomiting of blood; bloody or black bowel movements; swelling or pain in the belly; unusual weight change; or fatigue or loss of stamina. One or more of these tests may also be ordered when a person has been or may have been exposed to a hepatitis virus; has a family history of liver disease; has excessive alcohol intake; or is taking a drug that can cause liver damage. A liver panel usually includes 7 tests that are run at the same time on a blood sample. These include:  Alanine aminotransferase (ALT)  an enzyme mainly found in the liver; the best test for detecting hepatitis.  NORMAL FINDINGS  Elderly: may be slightly higher than adult values  Adult/child: 4-36 international units/L at 37 C or4-36 units/L (SI units)  Values may be higher in men and in African Americans.  Infant: may be twice as high as adult values. Alkaline phosphatase (ALP)  an enzyme related to the bile ducts; often increased when they are blocked NORMAL FINDINGS  Elderly: slightly higher than an adult.  Adult: 30-120 units/L or 0.5-2.0 microKat/L (SI units)  Child:/adolescent:  Less than 2 years: 85-235 units/L  2-8 Years: 65-210 units/L  9-15 years: 60-300 units/L  16-21 years: 30-200 units/L Aspartate aminotransferase (AST)  an enzyme found in the liver and a few other places, particularly the heart and other muscles in the body. NORMAL FINDINGS  Age / Normal value (units)  0-5 days / 35-140  Less than 3 yr / 15-60  3-6 yr / 15-50  6-12 yr / 10-50  12-18 yr / 10-40  Adult / 0-35 units/L or 0-0.58 microKat/L (SI units) (Females tend to have slightly lower than  males)  Elderly / Slightly higher than adults. Bilirubin  two different tests of bilirubin often used together (especially if a person has jaundice): total bilirubin measures all the bilirubin in the blood; direct bilirubin measures a form made in the liver.   Adult/elderly/child  Total bilirubin: 0.3-1.0 mg/dL or 5.1-17 micromole/L (SI units)  Indirect bilirubin: 0.2-0.8 mg/dL or 3.4-12.0 micromole/L (SI units)  Direct bilirubin: 0.1-0.3 mg/dL or 1.7-5.1 micromole/L (SI units)  Newborn total bilirubin:1.0-12.0 mg/dL or17.1-205 micromole/L (SI units)  Urine: 0.0-0.2 mg/dL Albumin  measures the main protein made by the liver and tells how well the liver is making this protein  Total Protein - measures albumin and all other proteins in blood, including antibodies made to help fight off infections.  NORMAL FINDINGS  Adult/Elderly  Total protein: 6.4-8.3 g/dL or 64-83 g/L (SI units)  Albumin: 3.5-5 g/dL or 35-50 g/L (SI units)  Globulin: 2.3-3.4 g/dL  Alpha1 globulin: 0.1-0.3 g/dL or 1-3 g/L (SI units)  Alpha2 globulin: 0.6-1 g/dL or 6-10 g/L (SI units)  Beta globulin: 0.7-1.1 g/dL or 7-11 g/L (SI units) Children  Total protein  Premature infant: 4.2-7.6 g/dL  Newborn: 4.6-7.4 g/dL  Infant: 6-6.7 g/dL  Child: 6.2-8 g/dL  Albumin  Premature infant: 3-4.2 g/dL  Newborn: 3.5-5.4 g/dL  Infant: 4.4-5.4 g/dL  Child: 4-5.9 g/dL Ranges for normal findings may vary among different laboratories and hospitals. You should always check with your doctor after having lab work or other tests done to discuss the meaning of your   test results and whether your values are considered within normal limits. MEANING OF TEST  Your caregiver will go over the test results with you and discuss the importance and meaning of your results, as well as treatment options and the need for additional tests if necessary. OBTAINING THE TEST RESULTS It is your responsibility to obtain your test results.  Ask the lab or department performing the test when and how you will get your results. Document Released: 10/08/2004 Document Revised: 12/06/2011 Document Reviewed: 08/26/2008 Exit[employer] Patient Information 2014 [employer].  

## 2013-02-20 ENCOUNTER — Telehealth: Payer: Self-pay

## 2013-02-20 NOTE — Telephone Encounter (Signed)
Pt called again for Lab Results for blood work done on 02/14/13.

## 2013-02-23 ENCOUNTER — Telehealth: Payer: Self-pay | Admitting: *Deleted

## 2013-02-23 NOTE — Telephone Encounter (Signed)
02/23/13 Attempt to reach patient message left for patient to return call. P.Taheera Thomann,RN

## 2013-02-23 NOTE — Telephone Encounter (Signed)
02/23/13 Unable to reach patient left message to return call. P.Maguire Killmer,RN

## 2013-02-27 ENCOUNTER — Telehealth: Payer: Self-pay | Admitting: *Deleted

## 2013-02-27 NOTE — Telephone Encounter (Signed)
02/26/13 Patient made aware to  Schedule appt in 4 weeks per D. Myers P.Luiz Ochoa

## 2013-03-06 ENCOUNTER — Ambulatory Visit: Payer: No Typology Code available for payment source | Attending: Family Medicine | Admitting: Family Medicine

## 2013-03-06 VITALS — BP 127/75 | HR 81 | Temp 98.8°F | Resp 17 | Wt 221.8 lb

## 2013-03-06 DIAGNOSIS — J029 Acute pharyngitis, unspecified: Secondary | ICD-10-CM | POA: Insufficient documentation

## 2013-03-06 LAB — POCT RAPID STREP A (OFFICE): Rapid Strep A Screen: NEGATIVE

## 2013-03-06 NOTE — Progress Notes (Signed)
Patient complains of having a cough and sore throat since last week Using OTC Delsym with no relief

## 2013-03-06 NOTE — Progress Notes (Signed)
Subjective:     Patient ID: Kurt Ross, male   DOB: 1966/09/21, 47 y.o.   MRN: [id number]  HPI Pt here with 1 week of sore throat, dry cough, and nasal congestion. His pre-school aged son had the same sx a week ago, now resolved. No fevers or GI sx. No facial pain but has mild maxillary pressure.  Pt requests oxygen to treat above problems.     Review of Systems per hpi     Objective:   Physical Exam  Nursing note and vitals reviewed. Constitutional: He appears well-developed and well-nourished.  HENT:  Right Ear: External ear normal.  Left Ear: External ear normal.  Pharynx very erythematous and slightly swollen  Cardiovascular: Normal rate, regular rhythm and normal heart sounds.   Pulmonary/Chest: Effort normal and breath sounds normal.  Skin: Skin is warm and dry.  Psychiatric: He has a normal mood and affect.       Assessment:     Acute pharyngitis - Plan: Rapid Strep A, CBC with Differential, Throat culture       Plan:     rst neg, will f/u tc due to  Having young child at home.  Checking cbc - if looks bacterial can send in abx (difficult, as is pcn allergic) Suspect viral uri. Discussed sx mangement.,  No indication for oxygen at this time.   F/u prn

## 2013-03-06 NOTE — Patient Instructions (Signed)
Sore Throat A sore throat is pain, burning, irritation, or scratchiness of the throat. There is often pain or tenderness when swallowing or talking. A sore throat may be accompanied by other symptoms, such as coughing, sneezing, fever, and swollen neck glands. A sore throat is often the first sign of another sickness, such as a cold, flu, strep throat, or mononucleosis (commonly known as mono). Most sore throats go away without medical treatment. CAUSES  The most common causes of a sore throat include:  A viral infection, such as a cold, flu, or mono.  A bacterial infection, such as strep throat, tonsillitis, or whooping cough.  Seasonal allergies.  Dryness in the air.  Irritants, such as smoke or pollution.  Gastroesophageal reflux disease (GERD). HOME CARE INSTRUCTIONS   Only take over-the-counter medicines as directed by your caregiver.  Drink enough fluids to keep your urine clear or pale yellow.  Rest as needed.  Try using throat sprays, lozenges, or sucking on hard candy to ease any pain (if older than 4 years or as directed).  Sip warm liquids, such as broth, herbal tea, or warm water with honey to relieve pain temporarily. You may also eat or drink cold or frozen liquids such as frozen ice pops.  Gargle with salt water (mix 1 tsp salt with 8 oz of water).  Do not smoke and avoid secondhand smoke.  Put a cool-mist humidifier in your bedroom at night to moisten the air. You can also turn on a hot shower and sit in the bathroom with the door closed for 5 10 minutes. SEEK IMMEDIATE MEDICAL CARE IF:  You have difficulty breathing.  You are unable to swallow fluids, soft foods, or your saliva.  You have increased swelling in the throat.  Your sore throat does not get better in 7 days.  You have nausea and vomiting.  You have a fever or persistent symptoms for more than 2 3 days.  You have a fever and your symptoms suddenly get worse. MAKE SURE YOU:   Understand  these instructions.  Will watch your condition.  Will get help right away if you are not doing well or get worse. Document Released: 10/21/2004 Document Revised: 08/30/2012 Document Reviewed: 05/21/2012 Exit[employer] Patient Information 2014 [employer].  

## 2013-03-16 ENCOUNTER — Telehealth: Payer: Self-pay

## 2013-03-16 NOTE — Telephone Encounter (Signed)
Pt says has been waiting for urology appt for 3 months and he is not sure why has not been scheduled. Please f/u with pt.

## 2013-03-20 NOTE — Telephone Encounter (Signed)
Pt aware that [facility] made the decision that pt don't nee to be seen.

## 2013-03-23 ENCOUNTER — Ambulatory Visit: Payer: No Typology Code available for payment source | Attending: Family Medicine

## 2013-03-23 DIAGNOSIS — R7989 Other specified abnormal findings of blood chemistry: Secondary | ICD-10-CM | POA: Insufficient documentation

## 2013-03-23 LAB — COMPLETE METABOLIC PANEL WITH GFR
ALT: 101 U/L — ABNORMAL HIGH (ref 0–53)
BUN: 12 mg/dL (ref 6–23)
CO2: 23 mEq/L (ref 19–32)
Creat: 0.96 mg/dL (ref 0.50–1.35)
GFR, Est African American: >89 mL/min
GFR, Est Non African American: >89 mL/min
Total Bilirubin: 0.6 mg/dL (ref 0.3–1.2)

## 2013-03-28 ENCOUNTER — Telehealth: Payer: Self-pay | Admitting: Family Medicine

## 2013-03-28 NOTE — Telephone Encounter (Signed)
Pt calling b/c has not received blood work results.  Please f/u.

## 2013-04-02 NOTE — Telephone Encounter (Signed)
Please review labs and advise Patient would like results

## 2013-04-02 NOTE — Telephone Encounter (Signed)
Pt called again about results 

## 2013-04-05 ENCOUNTER — Telehealth: Payer: Self-pay

## 2013-04-05 ENCOUNTER — Encounter: Payer: Self-pay | Admitting: Internal Medicine

## 2013-04-05 ENCOUNTER — Ambulatory Visit: Payer: No Typology Code available for payment source | Attending: Family Medicine | Admitting: Internal Medicine

## 2013-04-05 VITALS — BP 133/82 | HR 64 | Temp 98.5°F | Resp 16 | Ht 72.0 in | Wt 223.0 lb

## 2013-04-05 DIAGNOSIS — H1030 Unspecified acute conjunctivitis, unspecified eye: Secondary | ICD-10-CM

## 2013-04-05 DIAGNOSIS — H109 Unspecified conjunctivitis: Secondary | ICD-10-CM | POA: Insufficient documentation

## 2013-04-05 DIAGNOSIS — H1031 Unspecified acute conjunctivitis, right eye: Secondary | ICD-10-CM | POA: Insufficient documentation

## 2013-04-05 MED ORDER — CIPROFLOXACIN HCL 0.3 % OP SOLN: 2.0000 [drp] | Freq: Four times a day (QID) | OPHTHALMIC | Status: DC

## 2013-04-05 NOTE — Progress Notes (Signed)
Patient complains of redness itching and swelling in right eye x 1 day. Denies blurry vision.

## 2013-04-05 NOTE — Progress Notes (Signed)
Patient ID: Kurt Ross, male   DOB: Aug 23, 1966, 47 y.o.   MRN: [id number] Patient Demographics  Kurt Ross, is a 47 y.o. male  WUJ:[id number]  [id number]  DOB - 19-Dec-1965  Chief Complaint  Patient presents with  . Eye Pain        Subjective:   Kurt Ross is here for acute visit, he's been having some itchiness, redness and minimal drainage from his right eye since yesterday, denies any injuries or foreign body into the eye, no change in vision, no headache no eye pain. No fever chills. No exposure to sick contacts. He did have, and cold-like symptoms for the last 3 days with mildly runny nose and sneezing.  Denies any subjective complaints except as above, no active headache, no chest abdominal pain at this time, not short of breath. No focal weakness which is new.  Objective:    Patient Active Problem List   Diagnosis Date Noted  . Conjunctivitis, acute, right eye 04/05/2013  . Other abnormal blood chemistry 03/23/2013     Filed Vitals:   04/05/13 1310  BP: 133/82  Pulse: 64  Temp: 98.5 F (36.9 C)  TempSrc: Oral  Resp: 16  Height: 6' (1.829 m)  Weight: 223 lb (101.152 kg)  SpO2: 99%     Exam  Awake Alert, Oriented X 3, No new F.N deficits, Normal affect [place].AT,PERRAL, right eye mild conjunctival redness, clear discharge, visual acuity is 20 / 30 in both eyes Supple Neck,No JVD, No cervical lymphadenopathy appriciated.  Symmetrical Chest wall movement, Good air movement bilaterally, CTAB RRR,No Gallops,Rubs or new Murmurs, No Parasternal Heave +ve B.Sounds, Abd Soft, Non tender, No organomegaly appriciated, No rebound - guarding or rigidity. No Cyanosis, Clubbing or edema, No new Rash or bruise      Data Review   CBC No results found for this basename: WBC, HGB, HCT, PLT, MCV, MCH, MCHC, RDW, NEUTRABS, LYMPHSABS, MONOABS, EOSABS, BASOSABS, BANDABS, BANDSABD,  in the last 168 hours  Chemistries   No results found for this basename: NA, K, CL, CO2,  GLUCOSE, BUN, CREATININE, GFRCGP, CALCIUM, MG, AST, ALT, ALKPHOS, BILITOT,  in the last 168 hours ------------------------------------------------------------------------------------------------------------------ No results found for this basename: HGBA1C,  in the last 72 hours ------------------------------------------------------------------------------------------------------------------ No results found for this basename: CHOL, HDL, LDLCALC, TRIG, CHOLHDL, LDLDIRECT,  in the last 72 hours ------------------------------------------------------------------------------------------------------------------ No results found for this basename: TSH, T4TOTAL, FREET3, T3FREE, THYROIDAB,  in the last 72 hours ------------------------------------------------------------------------------------------------------------------ No results found for this basename: VITAMINB12, FOLATE, FERRITIN, TIBC, IRON, RETICCTPCT,  in the last 72 hours  Coagulation profile  No results found for this basename: INR, PROTIME,  in the last 168 hours     Prior to Admission medications   Medication Sig Start Date End Date Taking? Authorizing Provider  ciprofloxacin (CILOXAN) 0.3 % ophthalmic solution Place 2 drops into both eyes every 6 (six) hours. Administer 1 drop, every 2 hours, while awake, for 2 days. Then 1 drop, every 4 hours, while awake, for the next 5 days. 04/05/13   Leroy Sea, MD     Assessment & Plan      Right eye conjunctivitis most likely viral. However we'll place him on Cipro eye drops, given him strict instructions that if he develops any pain, vision loss, worsening of symptoms to go to the ER right away.  Leroy Sea M.D on 04/05/2013 at 1:12 PM

## 2013-04-05 NOTE — Telephone Encounter (Signed)
Patient is aware of labs °

## 2013-04-05 NOTE — Telephone Encounter (Signed)
Message copied by Lestine Mount on Thu Apr 05, 2013 12:56 PM ------      Message from: Dorothea Ogle      Created: Thu Feb 22, 2013 10:40 PM       Will need to have pt come in for repeat CMET in 4 weeks, thank you so much      Iskra ------

## 2013-04-06 ENCOUNTER — Telehealth: Payer: Self-pay | Admitting: Family Medicine

## 2013-04-06 ENCOUNTER — Other Ambulatory Visit: Payer: Self-pay | Admitting: Family Medicine

## 2013-04-06 MED ORDER — CIPROFLOXACIN HCL 0.3 % OP SOLN: 2.0000 [drp] | Freq: Four times a day (QID) | OPHTHALMIC | Status: DC

## 2013-04-06 NOTE — Telephone Encounter (Signed)
[facility] pharmacist calling to clarify directions for ciprofloxacin (CILOXAN) 0.3 % ophthalmic solution.  Please call back.

## 2013-04-06 NOTE — Telephone Encounter (Signed)
Clarify medication orders

## 2013-04-06 NOTE — Telephone Encounter (Signed)
New Rx with new instructions was sent over to [facility] today.    Maryln Manuel, MD

## 2013-05-10 ENCOUNTER — Ambulatory Visit: Payer: Self-pay | Attending: Family Medicine | Admitting: Internal Medicine

## 2013-05-10 ENCOUNTER — Encounter: Payer: Self-pay | Admitting: Internal Medicine

## 2013-05-10 VITALS — BP 134/81 | HR 65 | Temp 97.9°F

## 2013-05-10 DIAGNOSIS — K219 Gastro-esophageal reflux disease without esophagitis: Secondary | ICD-10-CM | POA: Insufficient documentation

## 2013-05-10 DIAGNOSIS — R7989 Other specified abnormal findings of blood chemistry: Secondary | ICD-10-CM

## 2013-05-10 LAB — CMP AND LIVER
ALT: 54 U/L — ABNORMAL HIGH (ref 0–53)
AST: 48 U/L — ABNORMAL HIGH (ref 0–37)
Albumin: 4 g/dL (ref 3.5–5.2)
Alkaline Phosphatase: 70 U/L (ref 39–117)
BUN: 8 mg/dL (ref 6–23)
Calcium: 9.1 mg/dL (ref 8.4–10.5)
Chloride: 102 mEq/L (ref 96–112)
Potassium: 4 mEq/L (ref 3.5–5.3)
Sodium: 139 mEq/L (ref 135–145)
Total Protein: 7.7 g/dL (ref 6.0–8.3)

## 2013-05-10 NOTE — Progress Notes (Signed)
Patient ID: Kurt Ross, male   DOB: 30-Sep-1965, 47 y.o.   MRN: [id number]  CC: Repeat labs HPI:  Mr.  Ross is a 47 year old African male presents to the clinic with no acute complaints. He is here to have repeat CMP to evaluate for increased liver enzymes (AST 54, ALT 101) . He denies abdominal pain or any other GI symptoms. Also, he denies any recent travel.  He admits to using herbal medicine to control his acid reflux.    Allergies  Allergen Reactions  . Codeine Nausea And Vomiting  . Penicillins Rash   Past Medical History  Diagnosis Date  . GERD (gastroesophageal reflux disease)    Current Outpatient Prescriptions on File Prior to Visit  Medication Sig Dispense Refill  . ciprofloxacin (CILOXAN) 0.3 % ophthalmic solution Place 2 drops into both eyes every 6 (six) hours.  5 mL  0   No current facility-administered medications on file prior to visit.   No family history on file. History   Social History  . Marital Status: Married    Spouse Name: N/A    Number of Children: N/A  . Years of Education: N/A   Occupational History  . Not on file.   Social History Main Topics  . Smoking status: Never Smoker   . Smokeless tobacco: Not on file  . Alcohol Use: No  . Drug Use: No  . Sexual Activity: Not on file   Other Topics Concern  . Not on file   Social History Narrative  . No narrative on file    Review of Systems ______ Constitutional: Negative for fever, chills, diaphoresis, activity change, appetite change and fatigue. ____ HENT: Negative for ear pain, nosebleeds, congestion, facial swelling, rhinorrhea, neck pain, neck stiffness and ear discharge.  ____ Eyes: Negative for pain, discharge, redness, itching and visual disturbance. ____ Respiratory: Negative for cough, choking, chest tightness, shortness of breath, wheezing and stridor.  ____ Cardiovascular: Negative for chest pain, palpitations and leg swelling. ____ Gastrointestinal: Negative for abdominal  distention. ____ Genitourinary: Negative for dysuria, urgency, frequency, hematuria, flank pain, decreased urine volume, difficulty urinating and dyspareunia. ____ Musculoskeletal: Negative for back pain, joint swelling, arthralgias and gait problem. ________ Neurological: Negative for dizziness, tremors, seizures, syncope, facial asymmetry, speech difficulty, weakness, light-headedness, numbness and headaches. ____ Hematological: Negative for adenopathy. Does not bruise/bleed easily. ____ Psychiatric/Behavioral: Negative for hallucinations, behavioral problems, confusion, dysphoric mood, decreased concentration and agitation. ______   Objective:   Filed Vitals:   05/10/13 1304  BP: 134/81  Pulse: 65  Temp: 97.9 F (36.6 C)    Physical Exam ______ Constitutional: Appears well-developed and well-nourished. No distress. ____ HENT: Normocephalic. External right and left ear normal. Oropharynx is clear and moist. ____ Eyes: Conjunctivae and EOM are normal. PERRLA, no scleral icterus. ____ Neck: Normal ROM. Neck supple. No JVD. No tracheal deviation. No thyromegaly. ____ CVS: RRR, S1/S2 +, no murmurs, no gallops, no carotid bruit.  Pulmonary: Effort and breath sounds normal, no stridor, rhonchi, wheezes, rales.  Abdominal: Soft. BS +,  no distension, tenderness, rebound or guarding. No hepatomegaly or ascites Musculoskeletal: Normal range of motion. No edema and no tenderness. ____ Lymphadenopathy: No lymphadenopathy noted, cervical, inguinal. Neuro: Alert. Normal reflexes, muscle tone coordination. No cranial nerve deficit. Skin: Skin is warm and dry. No rash noted. Not diaphoretic. No erythema. No pallor. ____ Psychiatric: Normal mood and affect. Behavior, judgment, thought content normal. __  Lab Results  Component Value Date   WBC 4.2 02/08/2013  HGB 17.0 02/08/2013   HCT 46.9 02/08/2013   MCV 83.5 02/08/2013   PLT 184 02/08/2013   Lab Results  Component Value Date   CREATININE  0.96 03/23/2013   BUN 12 03/23/2013   NA 139 03/23/2013   K 3.8 03/23/2013   CL 104 03/23/2013   CO2 23 03/23/2013    No results found for this basename: HGBA1C   Lipid Panel     Component Value Date/Time   CHOL 146 12/09/2009 2153   TRIG 74 12/09/2009 2153   HDL 33* 12/09/2009 2153   CHOLHDL 4.4 Ratio 12/09/2009 2153   VLDL 15 12/09/2009 2153   LDLCALC 98 12/09/2009 2153       Assessment and plan:   Patient Active Problem List   Diagnosis Date Noted  . Conjunctivitis, acute, right eye 04/05/2013  . Other abnormal blood chemistry 03/23/2013     - Will repeat CMP to evaluate liver function - Educated about stopping use of herbal medicine as contents are not always clear may be causing change in his liver function -  Follow up in 2 months  Kurt Ross was given clear instructions to go to ER or return to the clinic if symptoms don't improve, worsen or new problems develop.  Kurt Ross verbalized understanding.  Kurt Ross was told to call to get lab results if hasn't heard anything in the next week.    Jeanann Lewandowsky, MD

## 2013-07-10 ENCOUNTER — Ambulatory Visit: Payer: Self-pay

## 2013-07-17 ENCOUNTER — Ambulatory Visit: Payer: Self-pay | Attending: Internal Medicine | Admitting: Internal Medicine

## 2013-07-17 VITALS — BP 130/82 | HR 66 | Temp 98.5°F | Resp 16

## 2013-07-17 DIAGNOSIS — K219 Gastro-esophageal reflux disease without esophagitis: Secondary | ICD-10-CM

## 2013-07-17 MED ORDER — OMEPRAZOLE 40 MG PO CPDR: 40.0000 mg | DELAYED_RELEASE_CAPSULE | Freq: Every day | ORAL | Status: DC

## 2013-07-17 NOTE — Progress Notes (Signed)
Patient ID: Kurt Ross, male   DOB: March 17, 1966, 47 y.o.   MRN: [id number]  HPI 47 year old male was seen previously for elevated liver enzymes normal ultrasound of the abdomen here for followup and complains of acid reflux. Patient reports having acid reflux symptoms were part 20 years off-and-on . He reports symptoms to have subsided for a while but again started to develop for  past few weeks. He has taken over-the-counter zantac without much relief.  He denies any nausea, vomiting, hematemesis, abdominal pain, weight loss, fever, chills, chest pain, shortness of breath, bowel or urinary symptoms Denies any jaundice. He denies smoking or alcohol use. Reports use of caffeinated beverages, sodas and spicy foods.  Vital signs in last 24 hours:  Filed Vitals:   07/17/13 1642  BP: 130/82  Pulse: 66  Temp: 98.5 F (36.9 C)  Resp: 16  SpO2: 100%      Physical Exam:  General: middle aged male in in no acute distress. HEENT: no pallor, no icterus, moist oral mucosa,  Heart: Normal  s1 &s2  Regular rate and rhythm,  Lungs: Clear to auscultation bilaterally. Abdomen: Soft, nontender, nondistended, positive bowel sounds.    Lab Results:  Basic Metabolic Panel:    Component Value Date/Time   NA 139 05/10/2013 1320   K 4.0 05/10/2013 1320   CL 102 05/10/2013 1320   CO2 31 05/10/2013 1320   BUN 8 05/10/2013 1320   CREATININE 0.79 05/10/2013 1320   CREATININE 0.99 02/08/2013 1627   GLUCOSE 87 05/10/2013 1320   CALCIUM 9.1 05/10/2013 1320   CBC:    Component Value Date/Time   WBC 4.2 02/08/2013 1627   WBC 3.7* 02/25/2012 1822   HGB 17.0 02/08/2013 1627   HGB 15.1 02/25/2012 1822   HCT 46.9 02/08/2013 1627   HCT 46.6 02/25/2012 1822   PLT 184 02/08/2013 1627   MCV 83.5 02/08/2013 1627   MCV 87.4 02/25/2012 1822   NEUTROABS 1.4* 02/08/2013 1627   LYMPHSABS 2.0 02/08/2013 1627   MONOABS 0.6 02/08/2013 1627   EOSABS 0.1 02/08/2013 1627   BASOSABS 0.0 02/08/2013 1627    No results found for this  or any previous visit (from the past 240 hour(s)).  Studies/Results: No results found.  Medications: Scheduled Meds: Continuous Infusions: PRN Meds:.    Assessment/Plan: GERD Prescribe a course of omeprazole 40 mg daily for 4 weeks. Counseled on avoiding sodas, coffee and spicy foods Follow up in 1 month  transaminitis LFTs trending down on the last labs months back. We'll repeat another level during next visit  Followup in one month  Kurt Ross 07/17/2013, 4:57 PM

## 2013-07-17 NOTE — Progress Notes (Signed)
Patient here for follow up appointment  was having increased liver enzymes

## 2013-07-26 ENCOUNTER — Ambulatory Visit: Payer: Self-pay

## 2013-08-02 ENCOUNTER — Other Ambulatory Visit: Payer: Self-pay

## 2013-10-02 ENCOUNTER — Other Ambulatory Visit: Payer: Self-pay | Admitting: Internal Medicine

## 2013-10-02 MED ORDER — OMEPRAZOLE 40 MG PO CPDR: 40.0000 mg | DELAYED_RELEASE_CAPSULE | Freq: Every day | ORAL | Status: DC

## 2013-12-28 ENCOUNTER — Ambulatory Visit: Payer: No Typology Code available for payment source | Attending: Internal Medicine

## 2014-01-28 ENCOUNTER — Ambulatory Visit: Payer: No Typology Code available for payment source | Attending: Internal Medicine

## 2014-03-07 ENCOUNTER — Ambulatory Visit: Payer: No Typology Code available for payment source | Attending: Internal Medicine | Admitting: Internal Medicine

## 2014-03-07 ENCOUNTER — Encounter: Payer: Self-pay | Admitting: Internal Medicine

## 2014-03-07 VITALS — BP 127/79 | HR 62 | Temp 98.4°F | Resp 16 | Ht 70.0 in | Wt 225.0 lb

## 2014-03-07 DIAGNOSIS — Z139 Encounter for screening, unspecified: Secondary | ICD-10-CM

## 2014-03-07 DIAGNOSIS — R21 Rash and other nonspecific skin eruption: Secondary | ICD-10-CM | POA: Insufficient documentation

## 2014-03-07 DIAGNOSIS — R748 Abnormal levels of other serum enzymes: Secondary | ICD-10-CM | POA: Insufficient documentation

## 2014-03-07 DIAGNOSIS — Z131 Encounter for screening for diabetes mellitus: Secondary | ICD-10-CM | POA: Insufficient documentation

## 2014-03-07 LAB — POCT GLYCOSYLATED HEMOGLOBIN (HGB A1C): HEMOGLOBIN A1C: 5.5

## 2014-03-07 LAB — GLUCOSE, POCT (MANUAL RESULT ENTRY): POC GLUCOSE: 115 mg/dL — AB (ref 70–99)

## 2014-03-07 NOTE — Progress Notes (Signed)
Patient ID: Kurt Ross, male   DOB: 21-May-1966, 48 y.o.   MRN: [id number]  CC: f/u  HPI:  Patient presents today for a routine follow up.  He reports that he has a family history of diabetes and would like to be screened and he would like a colonoscopy as well. Patient c/o of rash on foot every summer for three years.  He reports that it presents as a bump and then it turns to dry skin.  He denies itch, redness, drainage, or warmth at site.  Patient reports that his acid reflux has improved greatly after making diet modifications.  Patient denies all other c/o today.  Allergies  Allergen Reactions  . Codeine Nausea And Vomiting  . Penicillins Rash   Past Medical History  Diagnosis Date  . GERD (gastroesophageal reflux disease)    Current Outpatient Prescriptions on File Prior to Visit  Medication Sig Dispense Refill  . ciprofloxacin (CILOXAN) 0.3 % ophthalmic solution Place 2 drops into both eyes every 6 (six) hours.  5 mL  0  . omeprazole (PRILOSEC) 40 MG capsule Take 1 capsule (40 mg total) by mouth daily.  90 capsule  1   No current facility-administered medications on file prior to visit.   History reviewed. No pertinent family history. History   Social History  . Marital Status: Married    Spouse Name: N/A    Number of Children: N/A  . Years of Education: N/A   Occupational History  . Not on file.   Social History Main Topics  . Smoking status: Never Smoker   . Smokeless tobacco: Not on file  . Alcohol Use: No  . Drug Use: No  . Sexual Activity: Not on file   Other Topics Concern  . Not on file   Social History Narrative  . No narrative on file    Review of Systems: See HPI   Objective:   Filed Vitals:   03/07/14 1044  BP: 127/79  Pulse: 62  Temp: 98.4 F (36.9 C)  Resp: 16   Physical Exam  Vitals reviewed. Constitutional: He is oriented to person, place, and time.  HENT:  Mouth/Throat: Oropharynx is clear and moist.  Impacted cerumen in  bilateral ears   Eyes: EOM are normal.  Neck: Normal range of motion. Neck supple. No JVD present.  Cardiovascular: Normal rate, regular rhythm and normal heart sounds.   Pulmonary/Chest: Effort normal and breath sounds normal.  Abdominal: Soft. Bowel sounds are normal. He exhibits no distension. There is no tenderness.  Musculoskeletal: Normal range of motion.  Lymphadenopathy:    He has no cervical adenopathy.  Neurological: He is alert and oriented to person, place, and time. He has normal reflexes.  Skin: Skin is warm and dry. Rash: dry scaly area on left medial foot. No pallor.     Lab Results  Component Value Date   WBC 4.2 02/08/2013   HGB 17.0 02/08/2013   HCT 46.9 02/08/2013   MCV 83.5 02/08/2013   PLT 184 02/08/2013   Lab Results  Component Value Date   CREATININE 0.79 05/10/2013   BUN 8 05/10/2013   NA 139 05/10/2013   K 4.0 05/10/2013   CL 102 05/10/2013   CO2 31 05/10/2013    Lab Results  Component Value Date   HGBA1C 5.5 03/07/2014   Lipid Panel     Component Value Date/Time   CHOL 146 12/09/2009 2153   TRIG 74 12/09/2009 2153   HDL 33* 12/09/2009 2153  CHOLHDL 4.4 Ratio 12/09/2009 2153   VLDL 15 12/09/2009 2153   LDLCALC 98 12/09/2009 2153       Assessment and plan:   Kurt Ross was seen today for follow-up and rash.  Diagnoses and associated orders for this visit:  Screening - HgB A1c - Glucose (CBG) - Ambulatory referral to Gastroenterology - PSA; Future - COMPLETE METABOLIC PANEL WITH GFR; Future - Lipid panel; Future - TSH; Future  Elevated liver enzymes - Hepatitis panel, acute; Future   Return in about 1 week (around 03/14/2014) for Lab Visit, PRN for PCP.      Kurt Ross, Kurt Strollo, NP-C [facility] [phone] 03/07/2014, 11:06 AM

## 2014-03-07 NOTE — Patient Instructions (Signed)
DASH Diet  The DASH diet stands for "Dietary Approaches to Stop Hypertension." It is a healthy eating plan that has been shown to reduce high blood pressure (hypertension) in as little as 14 days, while also possibly providing other significant health benefits. These other health benefits include reducing the risk of breast cancer after menopause and reducing the risk of type 2 diabetes, heart disease, colon cancer, and stroke. Health benefits also include weight loss and slowing kidney failure in patients with chronic kidney disease.   DIET GUIDELINES  · Limit salt (sodium). Your diet should contain less than 1500 mg of sodium daily.  · Limit refined or processed carbohydrates. Your diet should include mostly whole grains. Desserts and added sugars should be used sparingly.  · Include small amounts of heart-healthy fats. These types of fats include nuts, oils, and tub margarine. Limit saturated and trans fats. These fats have been shown to be harmful in the body.  CHOOSING FOODS   The following food groups are based on a 2000 calorie diet. See your Registered Dietitian for individual calorie needs.  Grains and Grain Products (6 to 8 servings daily)  · Eat More Often: Whole-wheat bread, brown rice, whole-grain or wheat pasta, quinoa, popcorn without added fat or salt (air popped).  · Eat Less Often: White bread, white pasta, white rice, cornbread.  Vegetables (4 to 5 servings daily)  · Eat More Often: Fresh, frozen, and canned vegetables. Vegetables may be raw, steamed, roasted, or grilled with a minimal amount of fat.  · Eat Less Often/Avoid: Creamed or fried vegetables. Vegetables in a cheese sauce.  Fruit (4 to 5 servings daily)  · Eat More Often: All fresh, canned (in natural juice), or frozen fruits. Dried fruits without added sugar. One hundred percent fruit juice (½ cup [237 mL] daily).  · Eat Less Often: Dried fruits with added sugar. Canned fruit in light or heavy syrup.  Lean Meats, Fish, and Poultry (2  servings or less daily. One serving is 3 to 4 oz [85-114 g]).  · Eat More Often: Ninety percent or leaner ground beef, tenderloin, sirloin. Round cuts of beef, chicken breast, turkey breast. All fish. Grill, bake, or broil your meat. Nothing should be fried.  · Eat Less Often/Avoid: Fatty cuts of meat, turkey, or chicken leg, thigh, or wing. Fried cuts of meat or fish.  Dairy (2 to 3 servings)  · Eat More Often: Low-fat or fat-free milk, low-fat plain or light yogurt, reduced-fat or part-skim cheese.  · Eat Less Often/Avoid: Milk (whole, 2%). Whole milk yogurt. Full-fat cheeses.  Nuts, Seeds, and Legumes (4 to 5 servings per week)  · Eat More Often: All without added salt.  · Eat Less Often/Avoid: Salted nuts and seeds, canned beans with added salt.  Fats and Sweets (limited)  · Eat More Often: Vegetable oils, tub margarines without trans fats, sugar-free gelatin. Mayonnaise and salad dressings.  · Eat Less Often/Avoid: Coconut oils, palm oils, butter, stick margarine, cream, half and half, cookies, candy, pie.  FOR MORE INFORMATION  The Dash Diet Eating Plan: www.dashdiet.org  Document Released: 09/02/2011 Document Revised: 12/06/2011 Document Reviewed: 09/02/2011  [employer]® Patient Information ©2014 [employer].

## 2014-03-07 NOTE — Progress Notes (Signed)
Pt here for screening of Diabetes due to family history Denies sx's C/o bumps to right lateral foot without pain or itching. States rash appears every summer Requesting Colonoscopy screening

## 2014-03-14 ENCOUNTER — Ambulatory Visit: Payer: No Typology Code available for payment source | Attending: Internal Medicine

## 2014-03-14 DIAGNOSIS — R7401 Elevation of levels of liver transaminase levels: Secondary | ICD-10-CM

## 2014-03-14 DIAGNOSIS — Z139 Encounter for screening, unspecified: Secondary | ICD-10-CM

## 2014-03-14 DIAGNOSIS — R748 Abnormal levels of other serum enzymes: Secondary | ICD-10-CM

## 2014-03-14 DIAGNOSIS — R740 Nonspecific elevation of levels of transaminase and lactic acid dehydrogenase [LDH]: Secondary | ICD-10-CM

## 2014-03-15 LAB — HEPATITIS PANEL, ACUTE
HCV AB: NEGATIVE
Hep A IgM: NONREACTIVE
Hep B C IgM: NONREACTIVE

## 2014-03-15 LAB — HEPATITIS B SURF AG CONFIRMATION: Hepatitis B Surf Ag Confirmation: POSITIVE — AB

## 2014-03-15 LAB — HEPATIC FUNCTION PANEL
ALT: 31 U/L (ref 0–53)
AST: 33 U/L (ref 0–37)
Albumin: 3.8 g/dL (ref 3.5–5.2)
Alkaline Phosphatase: 62 U/L (ref 39–117)
Bilirubin, Direct: 0.1 mg/dL (ref 0.0–0.3)
Indirect Bilirubin: 0.5 mg/dL (ref 0.2–1.2)
TOTAL PROTEIN: 7.1 g/dL (ref 6.0–8.3)
Total Bilirubin: 0.6 mg/dL (ref 0.2–1.2)

## 2014-03-15 LAB — LIPID PANEL
Cholesterol: 172 mg/dL (ref 0–200)
HDL: 41 mg/dL (ref >39)
LDL CALC: 122 mg/dL — AB (ref 0–99)
Total CHOL/HDL Ratio: 4.2 Ratio
Triglycerides: 44 mg/dL (ref <150)
VLDL: 9 mg/dL (ref 0–40)

## 2014-03-15 LAB — COMPLETE METABOLIC PANEL WITH GFR
ALBUMIN: 3.8 g/dL (ref 3.5–5.2)
ALT: 31 U/L (ref 0–53)
AST: 33 U/L (ref 0–37)
Alkaline Phosphatase: 62 U/L (ref 39–117)
BUN: 9 mg/dL (ref 6–23)
CALCIUM: 8.9 mg/dL (ref 8.4–10.5)
CHLORIDE: 102 meq/L (ref 96–112)
CO2: 22 meq/L (ref 19–32)
Creat: 0.8 mg/dL (ref 0.50–1.35)
GFR, Est Non African American: >89 mL/min
Glucose, Bld: 83 mg/dL (ref 70–99)
Potassium: 3.6 mEq/L (ref 3.5–5.3)
SODIUM: 138 meq/L (ref 135–145)
TOTAL PROTEIN: 7.1 g/dL (ref 6.0–8.3)
Total Bilirubin: 0.6 mg/dL (ref 0.2–1.2)

## 2014-03-15 LAB — TSH: TSH: 1.329 u[IU]/mL (ref 0.350–4.500)

## 2014-03-15 LAB — PSA: PSA: 0.53 ng/mL (ref <=4.00)

## 2014-03-17 ENCOUNTER — Other Ambulatory Visit: Payer: Self-pay | Admitting: Internal Medicine

## 2014-03-17 DIAGNOSIS — R768 Other specified abnormal immunological findings in serum: Secondary | ICD-10-CM

## 2014-03-27 ENCOUNTER — Telehealth: Payer: Self-pay | Admitting: *Deleted

## 2014-03-27 NOTE — Telephone Encounter (Signed)
Patient calling for lab results. Patient informed he needs further testing for Hep B. He tested positive for the antigen. Patient also notified cholesterol is slightly elevated. Explained about diet and lifestyle modifications to lower cholesterol. Patient verbalized understanding. Patient given an appointment for 03/28/2014 at 1000 for lab for further testing. Annamaria Hellingose,Jarrel Knoke Renee, RN

## 2014-03-28 ENCOUNTER — Ambulatory Visit: Payer: No Typology Code available for payment source | Attending: Internal Medicine

## 2014-03-28 DIAGNOSIS — R768 Other specified abnormal immunological findings in serum: Secondary | ICD-10-CM

## 2014-03-29 LAB — HEPATITIS B CORE ANTIBODY, IGM: HEP B C IGM: NONREACTIVE

## 2014-04-01 LAB — HEPATITIS B DNA, QUALITATIVE: Hepatitis B Virus DNA Qual: DETECTED — AB

## 2014-04-03 ENCOUNTER — Telehealth: Payer: Self-pay | Admitting: *Deleted

## 2014-04-03 ENCOUNTER — Other Ambulatory Visit: Payer: Self-pay | Admitting: Internal Medicine

## 2014-04-03 DIAGNOSIS — B181 Chronic viral hepatitis B without delta-agent: Secondary | ICD-10-CM

## 2014-04-03 NOTE — Telephone Encounter (Signed)
Patient calling in regards to his lab results. Patient informed that he is positive for Hepatitis B and that Cletus GashV. Keck, NP has sent him a referral to Infectious Disease for treatment. Patient verbalized understanding.

## 2014-04-16 ENCOUNTER — Telehealth: Payer: Self-pay | Admitting: Emergency Medicine

## 2014-04-16 NOTE — Telephone Encounter (Signed)
[facility] Bobbye Mortonammi Coates in regards to pt Hep B results.

## 2014-04-18 ENCOUNTER — Encounter: Payer: Self-pay | Admitting: Gastroenterology

## 2014-04-24 ENCOUNTER — Ambulatory Visit (INDEPENDENT_AMBULATORY_CARE_PROVIDER_SITE_OTHER): Payer: No Typology Code available for payment source | Admitting: Internal Medicine

## 2014-04-24 ENCOUNTER — Encounter: Payer: Self-pay | Admitting: Internal Medicine

## 2014-04-24 VITALS — BP 145/85 | HR 69 | Temp 97.8°F | Wt 219.0 lb

## 2014-04-24 DIAGNOSIS — Z23 Encounter for immunization: Secondary | ICD-10-CM

## 2014-04-24 DIAGNOSIS — B1910 Unspecified viral hepatitis B without hepatic coma: Secondary | ICD-10-CM

## 2014-04-24 LAB — HEPATITIS B SURFACE ANTIBODY,QUALITATIVE: Hep B S Ab: NEGATIVE

## 2014-04-24 LAB — HIV ANTIBODY (ROUTINE TESTING W REFLEX): HIV 1&2 Ab, 4th Generation: NONREACTIVE

## 2014-04-24 NOTE — Progress Notes (Signed)
Patient ID: Kurt Ross, male   DOB: Apr 02, 1966, 48 y.o.   MRN: [id number] Kurt Ross is a 48 y.o. male who presents for evaluation and management of chronic hepatitis b  HPI: 48 yo M, originally from [place]. Who was diagnosed with hepatitis B in 2014. He is referred by the community [facility] for evaluation and needs for treatment. No history of IVDU, no history of blood transfusion. He has been in [place] for 16 years. He had ultrasound in 2014. No prior treatment for hepatitis B. No past history of liver disease. Patient does not have family history of liver disease  Patient does not have documented immunity to Hepatitis A. Patient does not have hepatitis C  Allergies  Allergen Reactions  . Codeine Nausea And Vomiting  . Penicillins Rash   Current Outpatient Prescriptions on File Prior to Visit  Medication Sig Dispense Refill  . ciprofloxacin (CILOXAN) 0.3 % ophthalmic solution Place 2 drops into both eyes every 6 (six) hours.  5 mL  0  . omeprazole (PRILOSEC) 40 MG capsule Take 1 capsule (40 mg total) by mouth daily.  90 capsule  1   No current facility-administered medications on file prior to visit.   Active Ambulatory Problems    Diagnosis Date Noted  . Other abnormal blood chemistry 03/23/2013  . Conjunctivitis, acute, right eye 04/05/2013  . GERD (gastroesophageal reflux disease) 07/17/2013   Resolved Ambulatory Problems    Diagnosis Date Noted  . No Resolved Ambulatory Problems   No Additional Past Medical History     Review of Systems Review of Systems  Constitutional: Negative for fever, chills, diaphoresis, activity change, appetite change, fatigue and unexpected weight change.  HENT: Negative for congestion, sore throat, rhinorrhea, sneezing, trouble swallowing and sinus pressure.  Eyes: Negative for photophobia and visual disturbance.  Respiratory: Negative for cough, chest tightness, shortness of breath, wheezing and stridor.  Cardiovascular: Negative for chest pain,  palpitations and leg swelling.  Gastrointestinal:+ heart burn Negative for nausea, vomiting, abdominal pain, diarrhea, constipation, blood in stool, abdominal distention and anal bleeding.  Genitourinary: Negative for dysuria, hematuria, flank pain and difficulty urinating.  Musculoskeletal: Negative for myalgias, back pain, joint swelling, arthralgias and gait problem.  Skin: Negative for color change, pallor, rash and wound.  Neurological: Negative for dizziness, tremors, weakness and light-headedness.  Hematological: Negative for adenopathy. Does not bruise/bleed easily.  Psychiatric/Behavioral: Negative for behavioral problems, confusion, sleep disturbance, dysphoric mood, decreased concentration and agitation.     Past Medical History  Diagnosis Date  . GERD (gastroesophageal reflux disease)     History  Substance Use Topics  . Smoking status: Never Smoker   . Smokeless tobacco: Not on file  . Alcohol Use: No    No family history on file.    Objective:   Filed Vitals:   04/24/14 1048  BP: 145/85  Pulse: 69  Temp: 97.8 F (36.6 C)   Physical Exam  Constitutional: He is oriented to person, place, and time. He appears well-developed and well-nourished. No distress.  HENT:  Mouth/Throat: Oropharynx is clear and moist. No oropharyngeal exudate.  Cardiovascular: Normal rate, regular rhythm and normal heart sounds. Exam reveals no gallop and no friction rub.  No murmur heard.  Pulmonary/Chest: Effort normal and breath sounds normal. No respiratory distress. He has no wheezes.  Abdominal: Soft. Bowel sounds are normal. He exhibits no distension. There is no tenderness.  Lymphadenopathy:  He has no cervical adenopathy.  Neurological: He is alert and oriented to person, place, and  time.  Skin: Skin is warm and dry. No rash noted. No erythema.  Psychiatric: He has a normal mood and affect. His behavior is normal.    Laboratory  Lab Results  Component Value Date   WBC  4.2 02/08/2013   HGB 17.0 02/08/2013   HCT 46.9 02/08/2013   MCV 83.5 02/08/2013   PLT 184 02/08/2013    Lab Results  Component Value Date   CREATININE 0.80 03/14/2014   BUN 9 03/14/2014   NA 138 03/14/2014   K 3.6 03/14/2014   CL 102 03/14/2014   CO2 22 03/14/2014    Lab Results  Component Value Date   ALT 31 03/14/2014   ALT 31 03/14/2014   AST 33 03/14/2014   AST 33 03/14/2014   ALKPHOS 62 03/14/2014   ALKPHOS 62 03/14/2014   BILITOT 0.6 03/14/2014   BILITOT 0.6 03/14/2014     Radiology No components found with this basename: ULTRASOUNDABDOMEN, ULTRASOUNDHEPATICELASTOGRAPHY    Assessment: Chronic hepatitis B  Plan: 1) Patient counseled extensively on limiting acetaminophen to no more than 2 grams daily, avoidance of alcohol. 2) Transmission discussed with patient including sexual transmission, sharing razors and toothbrush.   3) Will need referral to gastroenterology: no 4) Will check hep b s ab, hep b e ag, hiv, hep b viral load 5) will give hep A #1, needs 2nd dose in 6 months. Flu shot at next visit

## 2014-04-26 LAB — HEPATITIS B E ANTIGEN: HEPATITIS BE ANTIGEN: NONREACTIVE

## 2014-04-26 LAB — HEPATITIS B E ANTIBODY: Hepatitis Be Antibody: REACTIVE — AB

## 2014-04-30 LAB — HEPATITIS B DNA, ULTRAQUANTITATIVE, PCR
Hepatitis B DNA (Calc): 2100648 copies/mL — ABNORMAL HIGH (ref <116)
Hepatitis B DNA: 360936 IU/mL — ABNORMAL HIGH (ref <20)

## 2014-05-30 ENCOUNTER — Encounter: Payer: Self-pay | Admitting: Internal Medicine

## 2014-05-30 ENCOUNTER — Ambulatory Visit (INDEPENDENT_AMBULATORY_CARE_PROVIDER_SITE_OTHER): Payer: No Typology Code available for payment source | Admitting: Internal Medicine

## 2014-05-30 VITALS — BP 132/77 | HR 73 | Temp 98.7°F | Wt 219.0 lb

## 2014-05-30 DIAGNOSIS — B1910 Unspecified viral hepatitis B without hepatic coma: Secondary | ICD-10-CM

## 2014-05-30 NOTE — Progress Notes (Signed)
   Subjective:    Patient ID: Kurt Ross, male    DOB: 09-17-1966, 48 y.o.   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 He is doing well since we last saw him. No jaundice, abdominal discomfort, fever, chills, weightloss, n/v/d.  Current Outpatient Prescriptions on File Prior to Visit  Medication Sig Dispense Refill  . omeprazole (PRILOSEC) 40 MG capsule Take 1 capsule (40 mg total) by mouth daily.  90 capsule  1  . ciprofloxacin (CILOXAN) 0.3 % ophthalmic solution Place 2 drops into both eyes every 6 (six) hours.  5 mL  0   No current facility-administered medications on file prior to visit.   Active Ambulatory Problems    Diagnosis Date Noted  . Other abnormal blood chemistry 03/23/2013  . Conjunctivitis, acute, right eye 04/05/2013  . GERD (gastroesophageal reflux disease) 07/17/2013   Resolved Ambulatory Problems    Diagnosis Date Noted  . No Resolved Ambulatory Problems   No Additional Past Medical History   Sochx: married  Review of Systems 10 point ros is negative per review    Objective:   Physical Exam No exam       Assessment & Plan:  Chronic hepatitis B = he will need repeat ultrasound as part of annual screening. We will refer him back to the community [facility] center to see if they have resources to help minimize cost of RUQ to assess for cirrhosis and HCC. He will need to also get 2nd hep vaccine in 5 months. We will repeat his liver function test and hepatitis B viral load at that time. We will also apply for patient assistance to get entecavir or tenofovir for treatment of hepatitis b. Presently no funding through [employer] is available for the fiscal year.  Recommend his wife to be screened for hep B with hep B surface antigen, hep B surface antibody to see if she may benefit from hep B immunization.  Health maintenance = recommend that he gets flu vaccination

## 2014-06-05 ENCOUNTER — Ambulatory Visit: Payer: No Typology Code available for payment source | Admitting: Internal Medicine

## 2014-10-31 ENCOUNTER — Ambulatory Visit: Payer: Self-pay | Admitting: Internal Medicine

## 2014-11-25 ENCOUNTER — Ambulatory Visit: Payer: Self-pay | Attending: Internal Medicine

## 2014-12-24 ENCOUNTER — Encounter: Payer: Self-pay | Admitting: Internal Medicine

## 2014-12-24 ENCOUNTER — Ambulatory Visit (INDEPENDENT_AMBULATORY_CARE_PROVIDER_SITE_OTHER): Payer: Self-pay | Admitting: Internal Medicine

## 2014-12-24 DIAGNOSIS — Z23 Encounter for immunization: Secondary | ICD-10-CM

## 2014-12-24 DIAGNOSIS — B181 Chronic viral hepatitis B without delta-agent: Secondary | ICD-10-CM

## 2014-12-24 LAB — COMPLETE METABOLIC PANEL WITH GFR
ALK PHOS: 71 U/L (ref 39–117)
ALT: 31 U/L (ref 0–53)
AST: 31 U/L (ref 0–37)
Albumin: 3.7 g/dL (ref 3.5–5.2)
BUN: 7 mg/dL (ref 6–23)
CO2: 27 mEq/L (ref 19–32)
CREATININE: 0.83 mg/dL (ref 0.50–1.35)
Calcium: 9 mg/dL (ref 8.4–10.5)
Chloride: 103 mEq/L (ref 96–112)
GFR, Est African American: >89 mL/min
GFR, Est Non African American: >89 mL/min
Glucose, Bld: 119 mg/dL — ABNORMAL HIGH (ref 70–99)
Potassium: 3.7 mEq/L (ref 3.5–5.3)
SODIUM: 139 meq/L (ref 135–145)
Total Bilirubin: 0.5 mg/dL (ref 0.2–1.2)
Total Protein: 7.3 g/dL (ref 6.0–8.3)

## 2014-12-24 MED ORDER — ENTECAVIR 0.5 MG PO TABS: 0.5000 mg | ORAL_TABLET | Freq: Every day | ORAL | Status: DC

## 2014-12-24 NOTE — Progress Notes (Signed)
  RFV: chronic hepatitis B Subjective:    Patient ID: Kurt Ross, male    DOB: 07/04/1966, 49 y.o.   MRN: [id number]  HPI Kurt Ross is a 49yo M originally from [place], has been in [place] for the past 18years. He was first referred to us for chronic hepatitis B management. He denies any ruq pain, no episodes of jaundice, light colored stool. No change in his insurance status  Allergies  Allergen Reactions  . Codeine Nausea And Vomiting  . Penicillins Rash   Current Outpatient Prescriptions on File Prior to Visit  Medication Sig Dispense Refill  . ciprofloxacin (CILOXAN) 0.3 % ophthalmic solution Place 2 drops into both eyes every 6 (six) hours. 5 mL 0  . omeprazole (PRILOSEC) 40 MG capsule Take 1 capsule (40 mg total) by mouth daily. 90 capsule 1   No current facility-administered medications on file prior to visit.   Active Ambulatory Problems    Diagnosis Date Noted  . Other abnormal blood chemistry 03/23/2013  . Conjunctivitis, acute, right eye 04/05/2013  . GERD (gastroesophageal reflux disease) 07/17/2013   Resolved Ambulatory Problems    Diagnosis Date Noted  . No Resolved Ambulatory Problems   No Additional Past Medical History   History  Substance Use Topics  . Smoking status: Never Smoker   . Smokeless tobacco: Not on file  . Alcohol Use: No    Review of Systems 10 point ros is negative     Objective:   Physical Exam BP 151/85 mmHg  Pulse 90  Temp(Src) 98.7 F (37.1 C) (Oral)  Wt 227 lb (102.967 kg) Physical Exam  Constitutional: He is oriented to person, place, and time. He appears well-developed and well-nourished. No distress.  HENT:  Mouth/Throat: Oropharynx is clear and moist. No oropharyngeal exudate.  Cardiovascular: Normal rate, regular rhythm and normal heart sounds. Exam reveals no gallop and no friction rub.  No murmur heard.  Pulmonary/Chest: Effort normal and breath sounds normal. No respiratory distress. He has no wheezes.  Abdominal: Soft. Bowel  sounds are normal. He exhibits no distension. There is no tenderness.  Lymphadenopathy:  He has no cervical adenopathy.  Neurological: He is alert and oriented to person, place, and time.  Skin: Skin is warm and dry. No rash noted. No erythema.  Psychiatric: He has a normal mood and affect. His behavior is normal.          Assessment & Plan:  Chronic hepatitis b = will check cmp, hbv viral load, afp, hep b e antigen, ruq u/s  imms = will give 2nd hep A  htn = recommend to follow up with community [facility] center

## 2014-12-25 LAB — AFP TUMOR MARKER: AFP-Tumor Marker: 8.5 ng/mL — ABNORMAL HIGH (ref <6.1)

## 2014-12-26 LAB — HEPATITIS B DNA, ULTRAQUANTITATIVE, PCR
HEPATITIS B DNA (CALC): 4414610 {copies}/mL — AB (ref <116)
Hepatitis B DNA: 758524 IU/mL — ABNORMAL HIGH (ref <20)

## 2014-12-26 LAB — HEPATITIS B E ANTIGEN: Hepatitis Be Antigen: NONREACTIVE

## 2015-01-01 ENCOUNTER — Ambulatory Visit (HOSPITAL_COMMUNITY): Payer: Self-pay

## 2015-01-08 ENCOUNTER — Ambulatory Visit (HOSPITAL_COMMUNITY)
Admission: RE | Admit: 2015-01-08 | Discharge: 2015-01-08 | Disposition: A | Discharge status: Home / Self Care | Payer: Self-pay | Source: Ambulatory Visit | Attending: Internal Medicine | Admitting: Internal Medicine

## 2015-01-08 DIAGNOSIS — B181 Chronic viral hepatitis B without delta-agent: Secondary | ICD-10-CM | POA: Insufficient documentation

## 2015-01-10 ENCOUNTER — Telehealth: Payer: Self-pay | Admitting: Licensed Clinical Social Worker

## 2015-01-10 NOTE — Telephone Encounter (Signed)
Patient called for results of ultrasound, results given.  He wants to know if Dr. Drue SecondSnider would like him to follow up.  He also states that he has acid reflux at night and I suggested otc prilosec until he finds a PCP. He states that he no longer is going to [facility].

## 2015-04-18 ENCOUNTER — Ambulatory Visit: Payer: Self-pay | Attending: Internal Medicine | Admitting: Internal Medicine

## 2015-04-18 ENCOUNTER — Encounter: Payer: Self-pay | Admitting: Internal Medicine

## 2015-04-18 VITALS — BP 132/80 | HR 64 | Temp 97.9°F | Resp 18 | Ht 72.25 in | Wt 229.2 lb

## 2015-04-18 DIAGNOSIS — K5900 Constipation, unspecified: Secondary | ICD-10-CM

## 2015-04-18 DIAGNOSIS — K219 Gastro-esophageal reflux disease without esophagitis: Secondary | ICD-10-CM

## 2015-04-18 MED ORDER — POLYETHYLENE GLYCOL 3350 17 GM/SCOOP PO POWD
17.0000 g | Freq: Every day | ORAL | Status: DC
Start: 2015-04-18 — End: 2022-08-16

## 2015-04-18 MED ORDER — OMEPRAZOLE 20 MG PO CPDR: 20.0000 mg | DELAYED_RELEASE_CAPSULE | Freq: Two times a day (BID) | ORAL | Status: DC

## 2015-04-18 NOTE — Patient Instructions (Signed)
Miralax to help with constipation  Take omeprazole when you wake up in the morning and wait at least 30 minutes to 1 hour before eating a meal  Food Choices for Gastroesophageal Reflux Disease When you have gastroesophageal reflux disease (GERD), the foods you eat and your eating habits are very important. Choosing the right foods can help ease the discomfort of GERD. WHAT GENERAL GUIDELINES DO I NEED TO FOLLOW?  Choose fruits, vegetables, whole grains, low-fat dairy products, and low-fat meat, fish, and poultry.  Limit fats such as oils, salad dressings, butter, nuts, and avocado.  Keep a food diary to identify foods that cause symptoms.  Avoid foods that cause reflux. These may be different for different people.  Eat frequent small meals instead of three large meals each day.  Eat your meals slowly, in a relaxed setting.  Limit fried foods.  Cook foods using methods other than frying.  Avoid drinking alcohol.  Avoid drinking large amounts of liquids with your meals.  Avoid bending over or lying down until 2-3 hours after eating. WHAT FOODS ARE NOT RECOMMENDED? The following are some foods and drinks that may worsen your symptoms: Vegetables Tomatoes. Tomato juice. Tomato and spaghetti sauce. Chili peppers. Onion and garlic. Horseradish. Fruits Oranges, grapefruit, and lemon (fruit and juice). Meats High-fat meats, fish, and poultry. This includes hot dogs, ribs, ham, sausage, salami, and bacon. Dairy Whole milk and chocolate milk. Sour cream. Cream. Butter. Ice cream. Cream cheese.  Beverages Coffee and tea, with or without caffeine. Carbonated beverages or energy drinks. Condiments Hot sauce. Barbecue sauce.  Sweets/Desserts Chocolate and cocoa. Donuts. Peppermint and spearmint. Fats and Oils High-fat foods, including [place] fries and potato chips. Other Vinegar. Strong spices, such as black pepper, white pepper, red pepper, cayenne, curry powder, cloves, ginger,  and chili powder. The items listed above may not be a complete list of foods and beverages to avoid. Contact your dietitian for more information. Document Released: 09/13/2005 Document Revised: 09/18/2013 Document Reviewed: 07/18/2013 [facility] Patient Information 2015 [place], [place]. This information is not intended to replace advice given to you by your health care provider. Make sure you discuss any questions you have with your health care provider.

## 2015-04-18 NOTE — Progress Notes (Signed)
Patient here for GERD. Patient denies any pain today. Patient has been experiencing acid reflux and indigestion. Patient wants a referral to a specialist for it. Patient is not taking any medications. Patient states that prilosec helped some when he was taking it but he is no longer taking that. Patient states he used to take nexium, but it did not help.

## 2015-04-18 NOTE — Progress Notes (Signed)
Patient ID: Kurt Ross, male   DOB: August 31, 1966, 49 y.o.   MRN: [id number]  CC: GERD  HPI: Kurt Ross is a 49 y.o. male here today for a follow up visit.  Patient has past medical history of GERD. He states that he has had acid reflux for 30 years and has been on nexium and omeprazole all without relief. He reports that is he is not on any medication currently because nothing helps. He states that he would like a referral to a GI specialist for further evaluation. He has increased gas and bloating.   Allergies  Allergen Reactions  . Codeine Nausea And Vomiting  . Penicillins Rash   Past Medical History  Diagnosis Date  . GERD (gastroesophageal reflux disease)    Current Outpatient Prescriptions on File Prior to Visit  Medication Sig Dispense Refill  . ciprofloxacin (CILOXAN) 0.3 % ophthalmic solution Place 2 drops into both eyes every 6 (six) hours. (Patient not taking: Reported on 04/18/2015) 5 mL 0  . entecavir (BARACLUDE) 0.5 MG tablet Take 1 tablet (0.5 mg total) by mouth daily. (Patient not taking: Reported on 04/18/2015) 30 tablet 11  . omeprazole (PRILOSEC) 40 MG capsule Take 1 capsule (40 mg total) by mouth daily. (Patient not taking: Reported on 04/18/2015) 90 capsule 1   No current facility-administered medications on file prior to visit.   History reviewed. No pertinent family history. History   Social History  . Marital Status: Married    Spouse Name: N/A  . Number of Children: N/A  . Years of Education: N/A   Occupational History  . Not on file.   Social History Main Topics  . Smoking status: Never Smoker   . Smokeless tobacco: Not on file  . Alcohol Use: No  . Drug Use: No  . Sexual Activity: Not on file   Other Topics Concern  . Not on file   Social History Narrative    Review of Systems  Gastrointestinal: Positive for heartburn, abdominal pain and constipation.  All other systems reviewed and are negative.   Objective:   Filed Vitals:   04/18/15  1712  BP: 132/80  Pulse: 64  Temp: 97.9 F (36.6 C)  Resp: 18    Physical Exam  Constitutional: He is oriented to person, place, and time.  Cardiovascular: Normal rate, regular rhythm and normal heart sounds.   Pulmonary/Chest: Effort normal and breath sounds normal.  Abdominal: Soft. Bowel sounds are normal. He exhibits no distension. There is tenderness.  Neurological: He is alert and oriented to person, place, and time.     Lab Results  Component Value Date   WBC 4.2 02/08/2013   HGB 17.0 02/08/2013   HCT 46.9 02/08/2013   MCV 83.5 02/08/2013   PLT 184 02/08/2013   Lab Results  Component Value Date   CREATININE 0.83 12/24/2014   BUN 7 12/24/2014   NA 139 12/24/2014   K 3.7 12/24/2014   CL 103 12/24/2014   CO2 27 12/24/2014    Lab Results  Component Value Date   HGBA1C 5.5 03/07/2014   Lipid Panel     Component Value Date/Time   CHOL 172 03/14/2014 0936   TRIG 44 03/14/2014 0936   HDL 41 03/14/2014 0936   CHOLHDL 4.2 03/14/2014 0936   VLDL 9 03/14/2014 0936   LDLCALC 122* 03/14/2014 0936       Assessment and plan:   Kurt Ross was seen today for gastrophageal reflux.  Diagnoses and all orders for this visit:  Gastroesophageal reflux disease, esophagitis presence not specified Orders: -     Begin omeprazole (PRILOSEC) 20 MG capsule; Take 1 capsule (20 mg total) by mouth 2 (two) times daily before a meal. Discussed diet and weight with patient relating to acid reflux.  Went over things that may exacerbate acid reflux such as tomatoes, spicy foods, coffee, carbonated beverages, chocolates, etc.  Advised patient to avoid laying down at least two hours after meals and sleep with HOB elevated.   Constipation, unspecified constipation type Orders: -     polyethylene glycol powder (GLYCOLAX/MIRALAX) powder; Take 17 g by mouth daily. Explained that the daily recommended amount of fiber is 25 g, went over high fiber foods, encourage increased water intake,  miralax use, and increased physical activity.  Patient given constipation handout.    Return if symptoms worsen or fail to improve.       Ambrose Finland, NP-C [facility] [phone] 04/18/2015, 5:30 PM

## 2015-04-29 ENCOUNTER — Telehealth: Payer: Self-pay | Admitting: Internal Medicine

## 2015-04-29 NOTE — Telephone Encounter (Signed)
Patient called requesting to speak to nurse regarding medication omeprazole (PRILOSEC) 20 MG capsule. Please f/u

## 2015-05-14 NOTE — Telephone Encounter (Signed)
Patient called to speak to nurse for instructions on how to take Prilosec. Please f/u

## 2015-07-25 ENCOUNTER — Ambulatory Visit: Payer: Self-pay | Attending: Family Medicine

## 2015-07-28 ENCOUNTER — Telehealth: Payer: Self-pay

## 2015-07-28 NOTE — Telephone Encounter (Signed)
Nurse called patient, patient verified date of birth. Patient reports taking omeprazole as prescribed on bottle, 1 tab, two times a day, before meals. Patient has no questions at this time.

## 2019-09-06 ENCOUNTER — Emergency Department (HOSPITAL_BASED_OUTPATIENT_CLINIC_OR_DEPARTMENT_OTHER): Payer: Self-pay

## 2019-09-06 ENCOUNTER — Emergency Department (HOSPITAL_BASED_OUTPATIENT_CLINIC_OR_DEPARTMENT_OTHER)
Admission: EM | Admit: 2019-09-06 | Discharge: 2019-09-06 | Disposition: A | Discharge status: Home / Self Care | Payer: Self-pay | Attending: Emergency Medicine | Admitting: Emergency Medicine

## 2019-09-06 ENCOUNTER — Encounter (HOSPITAL_BASED_OUTPATIENT_CLINIC_OR_DEPARTMENT_OTHER): Payer: Self-pay | Admitting: Emergency Medicine

## 2019-09-06 DIAGNOSIS — S93492A Sprain of other ligament of left ankle, initial encounter: Secondary | ICD-10-CM | POA: Insufficient documentation

## 2019-09-06 DIAGNOSIS — S92252A Displaced fracture of navicular [scaphoid] of left foot, initial encounter for closed fracture: Secondary | ICD-10-CM | POA: Insufficient documentation

## 2019-09-06 DIAGNOSIS — Z79899 Other long term (current) drug therapy: Secondary | ICD-10-CM | POA: Insufficient documentation

## 2019-09-06 DIAGNOSIS — Y9289 Other specified places as the place of occurrence of the external cause: Secondary | ICD-10-CM | POA: Insufficient documentation

## 2019-09-06 DIAGNOSIS — W19XXXA Unspecified fall, initial encounter: Secondary | ICD-10-CM

## 2019-09-06 DIAGNOSIS — Y9389 Activity, other specified: Secondary | ICD-10-CM | POA: Insufficient documentation

## 2019-09-06 DIAGNOSIS — R52 Pain, unspecified: Secondary | ICD-10-CM

## 2019-09-06 DIAGNOSIS — Y998 Other external cause status: Secondary | ICD-10-CM | POA: Insufficient documentation

## 2019-09-06 DIAGNOSIS — W010XXA Fall on same level from slipping, tripping and stumbling without subsequent striking against object, initial encounter: Secondary | ICD-10-CM | POA: Insufficient documentation

## 2019-09-06 HISTORY — DX: Unspecified viral hepatitis B without hepatic coma: B19.10

## 2019-09-06 MED ORDER — OXYCODONE HCL 5 MG PO TABS: 2.5000 mg | ORAL_TABLET | Freq: Four times a day (QID) | ORAL | 0 refills | Status: AC | PRN

## 2019-09-06 NOTE — ED Provider Notes (Addendum)
[facility] EMERGENCY DEPARTMENT Provider Note  CSN: [id number] Arrival date & time: 09/06/19 0136  Chief Complaint(s) Foot Pain  HPI [name] is a 53 y.o. male who presents to the emergency department with left ankle/foot pain following mechanical fall down a flight of stairs.  Patient reports that he slipped while walking down the flight of stairs and twisted his ankle.  Patient reported immediate mild pain.  States that he was able to ambulate however pain gradually worsened throughout the night.  He attempted to put ice on it with little relief.  He is endorsing associated swelling to the dorsum of the foot.  Denies any loss of consciousness.  No other physical complaints or injuries.  HPI  Past Medical History Past Medical History:  Diagnosis Date  . GERD (gastroesophageal reflux disease)   . Hepatitis B    Patient Active Problem List   Diagnosis Date Noted  . GERD (gastroesophageal reflux disease) 07/17/2013  . Conjunctivitis, acute, right eye 04/05/2013  . Other abnormal blood chemistry 03/23/2013   Home Medication(s) Prior to Admission medications   Medication Sig Start Date End Date Taking? Authorizing Provider  ciprofloxacin (CILOXAN) 0.3 % ophthalmic solution Place 2 drops into both eyes every 6 (six) hours. Patient not taking: Reported on 04/18/2015 04/06/13   Cleora FleetJohnson, Clanford L, MD  entecavir (BARACLUDE) 0.5 MG tablet Take 1 tablet (0.5 mg total) by mouth daily. Patient not taking: Reported on 04/18/2015 12/24/14   Judyann MunsonSnider, Cynthia, MD  omeprazole (PRILOSEC) 20 MG capsule Take 1 capsule (20 mg total) by mouth 2 (two) times daily before a meal. 04/18/15   Ambrose FinlandKeck, Valerie A, NP  oxyCODONE (ROXICODONE) 5 MG immediate release tablet Take 0.5-1 tablets (2.5-5 mg total) by mouth every 6 (six) hours as needed for up to 5 days for severe pain or breakthrough pain. 09/06/19 09/11/19  Nira Connardama,  Eduardo, MD  polyethylene glycol powder (GLYCOLAX/MIRALAX) powder Take 17  g by mouth daily. 04/18/15   Ambrose FinlandKeck, Valerie A, NP                                                                                                                                    Past Surgical History History reviewed. No pertinent surgical history. Family History History reviewed. No pertinent family history.  Social History Social History   Tobacco Use  . Smoking status: Never Smoker  . Smokeless tobacco: Never Used  Substance Use Topics  . Alcohol use: No  . Drug use: No   Allergies Codeine, Nsaids, and Penicillins  Review of Systems Review of Systems All other systems are reviewed and are negative for acute change except as noted in the HPI  Physical Exam Vital Signs  I have reviewed the triage vital signs BP (!) 144/77 (BP Location: Left Arm)   Pulse 60   Temp 98.4 F (36.9 C) (Oral)   Resp 18   Ht 6\' 1"  (1.854 m)  Wt 117.9 kg   SpO2 100%   BMI 34.30 kg/m   Physical Exam Constitutional:      General: He is not in acute distress.    Appearance: He is well-developed. He is not diaphoretic.  HENT:     Head: Normocephalic.     Right Ear: External ear normal.     Left Ear: External ear normal.  Eyes:     General: No scleral icterus.       Right eye: No discharge.        Left eye: No discharge.     Conjunctiva/sclera: Conjunctivae normal.     Pupils: Pupils are equal, round, and reactive to light.  Cardiovascular:     Rate and Rhythm: Regular rhythm.     Pulses:          Radial pulses are 2+ on the right side and 2+ on the left side.       Dorsalis pedis pulses are 2+ on the right side and 2+ on the left side.     Heart sounds: Normal heart sounds. No murmur. No friction rub. No gallop.   Pulmonary:     Effort: Pulmonary effort is normal. No respiratory distress.     Breath sounds: Normal breath sounds. No stridor.  Abdominal:     General: There is no distension.     Palpations: Abdomen is soft.     Tenderness: There is no abdominal tenderness.   Musculoskeletal:     Cervical back: Normal range of motion and neck supple. No bony tenderness.     Thoracic back: No bony tenderness.     Lumbar back: No bony tenderness.     Left ankle: Tenderness present over the lateral malleolus, medial malleolus and ATF ligament. No base of 5th metatarsal tenderness. Normal range of motion.     Left foot: Normal capillary refill. Swelling and tenderness present. Normal pulse.     Comments: Clavicle stable. Chest stable to AP/Lat compression. Pelvis stable to Lat compression. No obvious extremity deformity. No chest or abdominal wall contusion.  Skin:    General: Skin is warm.  Neurological:     Mental Status: He is alert and oriented to person, place, and time.     GCS: GCS eye subscore is 4. GCS verbal subscore is 5. GCS motor subscore is 6.     Comments: Moving all extremities      ED Results and Treatments Labs (all labs ordered are listed, but only abnormal results are displayed) Labs Reviewed - No data to display                                                                                                                       EKG  EKG Interpretation  Date/Time:    Ventricular Rate:    PR Interval:    QRS Duration:   QT Interval:    QTC Calculation:   R Axis:     Text Interpretation:  Radiology DG Ankle Complete Left  Result Date: 09/06/2019 CLINICAL DATA:  Diffuse ankle pain EXAM: LEFT ANKLE COMPLETE - 3+ VIEW COMPARISON:  None. FINDINGS: Small fleck of bone at the dorsal navicular on the lateral view. No malalignment or ankle joint effusion. IMPRESSION: Calcification versus small avulsion fracture at the dorsal navicular. Electronically Signed   By: Marnee Spring M.D.   On: 09/06/2019 04:08   DG Foot Complete Left  Result Date: 09/06/2019 CLINICAL DATA:  Diffuse left foot and ankle pain today EXAM: LEFT FOOT - COMPLETE 3+ VIEW COMPARISON:  None. FINDINGS: Fragmentation at the medial navicular appears corticated  and is best attributed to ossicle. A peroneal ossicle is also noted. No erosion or malalignment. Osteopenic appearance. IMPRESSION: Fragmentation at the medial navicular appears corticated and from developmental ossicle, please correlate for focal tenderness. Electronically Signed   By: Marnee Spring M.D.   On: 09/06/2019 04:06    Pertinent labs & imaging results that were available during my care of the patient were reviewed by me and considered in my medical decision making (see chart for details).  Medications Ordered in ED Medications - No data to display                                                                                                                                  Procedures Procedures  (including critical care time)  Medical Decision Making / ED Course I have reviewed the nursing notes for this encounter and the patient's prior records (if available in EHR or on provided paperwork).   Kurt Ross was evaluated in Emergency Department on 09/06/2019 for the symptoms described in the history of present illness. He was evaluated in the context of the global COVID-19 pandemic, which necessitated consideration that the patient might be at risk for infection with the SARS-CoV-2 virus that causes COVID-19. Institutional protocols and algorithms that pertain to the evaluation of patients at risk for COVID-19 are in a state of rapid change based on information released by regulatory bodies including the CDC and federal and state organizations. These policies and algorithms were followed during the patient's care in the ED.  Mechanical fall. Left foot pain. Plain film with navicular avulsion fracture. CAM walker and crutches provided. Sports med follow up.  The patient appears reasonably screened and/or stabilized for discharge and I doubt any other medical condition or other [facility] requiring further screening, evaluation, or treatment in the ED at this time prior to discharge.    The patient is safe for discharge with strict return precautions.      Final Clinical Impression(s) / ED Diagnoses Final diagnoses:  Fall  Closed avulsion fracture of navicular bone of left foot, initial encounter  Sprain of anterior talofibular ligament of left ankle, initial encounter    The patient appears reasonably screened and/or stabilized for discharge and I doubt any other medical condition or other [facility] requiring further screening, evaluation, or treatment  in the ED at this time prior to discharge.  Disposition: Discharge  Condition: Good  I have discussed the results, Dx and Tx plan with the patient who expressed understanding and agree(s) with the plan. Discharge instructions discussed at great length. The patient was given strict return precautions who verbalized understanding of the instructions. No further questions at time of discharge.    ED Discharge Orders         Ordered    oxyCODONE (ROXICODONE) 5 MG immediate release tablet  Every 6 hours PRN     09/06/19 0417          [facility] narcotic database reviewed and no active prescriptions noted.   Follow Up: Myra Rude, MD [address] [place] [address] [phone]  Schedule an appointment as soon as possible for a visit  in 5-7 days, For close follow up to assess for foot fracture      This chart was dictated using voice recognition software.  Despite best efforts to proofread,  errors can occur which can change the documentation meaning.   Nira Conn, MD 09/06/19 1308    Nira Conn, MD 09/07/19 [phone]

## 2019-09-06 NOTE — ED Notes (Signed)
Pt arrived during computer downtime. See paper chart.  

## 2019-09-06 NOTE — ED Triage Notes (Signed)
Pt brought in by [facility]  Pt fell from second to the bottom step twisting left foot  Pt reports 7/10 pain  Pt struck left side of head against wall when he fell

## 2019-09-13 ENCOUNTER — Ambulatory Visit: Payer: Self-pay | Admitting: Family Medicine

## 2019-09-13 NOTE — Progress Notes (Deleted)
  Kurt Ross - 53 y.o. male MRN [id number]  Date of birth: [birth year]  SUBJECTIVE:  Including CC & ROS.  No chief complaint on file.   Kurt Ross is a 53 y.o. male that is  ***.  ***   Review of Systems  HISTORY: Past Medical, Surgical, Social, and Family History Reviewed & Updated per EMR.   Pertinent Historical Findings include:  Past Medical History:  Diagnosis Date  . GERD (gastroesophageal reflux disease)   . Hepatitis B     No past surgical history on file.  Allergies  Allergen Reactions  . Codeine Nausea And Vomiting  . Nsaids   . Penicillins Rash    No family history on file.   Social History   Socioeconomic History  . Marital status: Married    Spouse name: Not on file  . Number of children: Not on file  . Years of education: Not on file  . Highest education level: Not on file  Occupational History  . Not on file  Tobacco Use  . Smoking status: Never Smoker  . Smokeless tobacco: Never Used  Substance and Sexual Activity  . Alcohol use: No  . Drug use: No  . Sexual activity: Not on file  Other Topics Concern  . Not on file  Social History Narrative  . Not on file   Social Determinants of Health   Financial Resource Strain:   . Difficulty of Paying Living Expenses: Not on file  Food Insecurity:   . Worried About Charity fundraiser in the Last Year: Not on file  . Ran Out of Food in the Last Year: Not on file  Transportation Needs:   . Lack of Transportation (Medical): Not on file  . Lack of Transportation (Non-Medical): Not on file  Physical Activity:   . Days of Exercise per Week: Not on file  . Minutes of Exercise per Session: Not on file  Stress:   . Feeling of Stress : Not on file  Social Connections:   . Frequency of Communication with Friends and Family: Not on file  . Frequency of Social Gatherings with Friends and Family: Not on file  . Attends Religious Services: Not on file  . Active Member of Clubs or Organizations: Not on  file  . Attends Archivist Meetings: Not on file  . Marital Status: Not on file  Intimate Partner Violence:   . Fear of Current or Ex-Partner: Not on file  . Emotionally Abused: Not on file  . Physically Abused: Not on file  . Sexually Abused: Not on file     PHYSICAL EXAM:  VS: There were no vitals taken for this visit. Physical Exam Gen: NAD, alert, cooperative with exam, well-appearing ENT: normal lips, normal nasal mucosa,  Eye: normal EOM, normal conjunctiva and lids CV:  no edema, +2 pedal pulses   Resp: no accessory muscle use, non-labored,  GI: no masses or tenderness, no hernia  Skin: no rashes, no areas of induration  Neuro: normal tone, normal sensation to touch Psych:  normal insight, alert and oriented MSK:  ***      ASSESSMENT & PLAN:   No problem-specific Assessment & Plan notes found for this encounter.

## 2019-09-22 ENCOUNTER — Other Ambulatory Visit: Payer: Self-pay

## 2019-09-22 DIAGNOSIS — K219 Gastro-esophageal reflux disease without esophagitis: Secondary | ICD-10-CM | POA: Insufficient documentation

## 2019-09-22 LAB — COMPREHENSIVE METABOLIC PANEL
ALT: 35 U/L (ref 0–44)
AST: 36 U/L (ref 15–41)
Albumin: 4.1 g/dL (ref 3.5–5.0)
Alkaline Phosphatase: 73 U/L (ref 38–126)
Anion gap: 9 (ref 5–15)
BUN: 8 mg/dL (ref 6–20)
CO2: 27 mmol/L (ref 22–32)
Calcium: 8.7 mg/dL — ABNORMAL LOW (ref 8.9–10.3)
Chloride: 98 mmol/L (ref 98–111)
Creatinine, Ser: 0.95 mg/dL (ref 0.61–1.24)
GFR calc Af Amer: >60 mL/min (ref >60)
GFR calc non Af Amer: >60 mL/min (ref >60)
Glucose, Bld: 106 mg/dL — ABNORMAL HIGH (ref 70–99)
Potassium: 3.5 mmol/L (ref 3.5–5.1)
Sodium: 134 mmol/L — ABNORMAL LOW (ref 135–145)
Total Bilirubin: 0.5 mg/dL (ref 0.3–1.2)
Total Protein: 8 g/dL (ref 6.5–8.1)

## 2019-09-22 LAB — URINALYSIS, ROUTINE W REFLEX MICROSCOPIC
Bilirubin Urine: NEGATIVE
Glucose, UA: NEGATIVE mg/dL
Hgb urine dipstick: NEGATIVE
Ketones, ur: NEGATIVE mg/dL
Leukocytes,Ua: NEGATIVE
Nitrite: NEGATIVE
Protein, ur: NEGATIVE mg/dL
Specific Gravity, Urine: 1.006 (ref 1.005–1.030)
pH: 6 (ref 5.0–8.0)

## 2019-09-22 LAB — CBC
HCT: 48.1 % (ref 39.0–52.0)
Hemoglobin: 16.1 g/dL (ref 13.0–17.0)
MCH: 30.3 pg (ref 26.0–34.0)
MCHC: 33.5 g/dL (ref 30.0–36.0)
MCV: 90.4 fL (ref 80.0–100.0)
Platelets: 183 10*3/uL (ref 150–400)
RBC: 5.32 MIL/uL (ref 4.22–5.81)
RDW: 13.3 % (ref 11.5–15.5)
WBC: 3.7 10*3/uL — ABNORMAL LOW (ref 4.0–10.5)
nRBC: 0 % (ref 0.0–0.2)

## 2019-09-22 LAB — LIPASE, BLOOD: Lipase: 35 U/L (ref 11–51)

## 2019-09-22 MED ORDER — SODIUM CHLORIDE 0.9% FLUSH: 3.0000 mL | Freq: Once | INTRAVENOUS | Status: DC

## 2019-09-22 NOTE — ED Triage Notes (Signed)
Patient complaining of spitting up blood and abdominal pain. Patient states it started two days ago. Patient states he thought it was gas.

## 2019-09-23 ENCOUNTER — Emergency Department (HOSPITAL_COMMUNITY)
Admission: EM | Admit: 2019-09-23 | Discharge: 2019-09-23 | Disposition: A | Discharge status: Home / Self Care | Payer: Self-pay | Attending: Emergency Medicine | Admitting: Emergency Medicine

## 2019-09-23 DIAGNOSIS — K219 Gastro-esophageal reflux disease without esophagitis: Secondary | ICD-10-CM

## 2019-09-23 MED ORDER — PANTOPRAZOLE SODIUM 20 MG PO TBEC: 20.0000 mg | DELAYED_RELEASE_TABLET | Freq: Every day | ORAL | 1 refills | Status: DC

## 2019-09-23 NOTE — Discharge Instructions (Signed)
Your work-up in the emergency department today has been reassuring. We have started you on Protonix which you should take daily as prescribed. Avoid foods that can worsen reflux such as tomatoes or citrus fruits, coffee, chocolate, alcohol, spicy foods. We recommend close follow-up with your primary care doctor. Should you experience ongoing symptoms, you may see a gastroenterologist if desired. Return to the ED for any new or concerning symptoms.

## 2019-09-23 NOTE — ED Provider Notes (Signed)
[facility] DEPT Provider Note   CSN: [id number] Arrival date & time: 09/22/19  1093     History Chief Complaint  Patient presents with  . Hematemesis  . Abdominal Pain    Kurt Ross is a 53 y.o. male.   53 year old male with a history of reflux and hepatitis B presents to the emergency department complaining of worsening reflux symptoms.  He states that he has had indigestion which is waxing and waning with associated abdominal bloating.  His reflux is often worse at night, when lying down.  However, over the past few days it has been present during the day as well.  Occasionally he will note that he spits up some bright red blood.  He has not had any hematemesis, nausea, spitting up clots, melena, hematochezia, fevers.  Intermittently takes over-the-counter medication for reflux.  Used Nexium over the past 2 days.  Has not followed up with his primary care doctor since March.  Denies ever being seen by a gastroenterologist.  The history is provided by the patient. No language interpreter was used.  Abdominal Pain      Past Medical History:  Diagnosis Date  . GERD (gastroesophageal reflux disease)   . Hepatitis B     Patient Active Problem List   Diagnosis Date Noted  . GERD (gastroesophageal reflux disease) 07/17/2013  . Conjunctivitis, acute, right eye 04/05/2013  . Other abnormal blood chemistry 03/23/2013    No past surgical history on file.     No family history on file.  Social History   Tobacco Use  . Smoking status: Never Smoker  . Smokeless tobacco: Never Used  Substance Use Topics  . Alcohol use: No  . Drug use: No    Home Medications Prior to Admission medications   Medication Sig Start Date End Date Taking? Authorizing Provider  ciprofloxacin (CILOXAN) 0.3 % ophthalmic solution Place 2 drops into both eyes every 6 (six) hours. Patient not taking: Reported on 04/18/2015 04/06/13   Murlean Iba, MD  entecavir  (BARACLUDE) 0.5 MG tablet Take 1 tablet (0.5 mg total) by mouth daily. Patient not taking: Reported on 04/18/2015 12/24/14   Carlyle Basques, MD  pantoprazole (PROTONIX) 20 MG tablet Take 1 tablet (20 mg total) by mouth daily. 09/23/19   Antonietta Breach, PA-C  polyethylene glycol powder (GLYCOLAX/MIRALAX) powder Take 17 g by mouth daily. 04/18/15   Lance Bosch, NP    Allergies    Codeine, Nsaids, and Penicillins  Review of Systems   Review of Systems  Gastrointestinal: Positive for abdominal pain.  Ten systems reviewed and are negative for acute change, except as noted in the HPI.    Physical Exam Updated Vital Signs BP (!) 144/83 (BP Location: Left Arm)   Pulse 68   Temp 98.4 F (36.9 C) (Oral)   Resp 17   Ht 6' (1.829 m)   Wt 98 kg   SpO2 98%   BMI 29.29 kg/m   Physical Exam Vitals and nursing note reviewed.  Constitutional:      General: He is not in acute distress.    Appearance: He is well-developed. He is not diaphoretic.     Comments: Nontoxic appearing and in NAD  HENT:     Head: Normocephalic and atraumatic.  Eyes:     General: No scleral icterus.    Conjunctiva/sclera: Conjunctivae normal.  Cardiovascular:     Rate and Rhythm: Normal rate and regular rhythm.     Pulses: Normal pulses.  Pulmonary:     Effort: Pulmonary effort is normal. No respiratory distress.     Comments: Respirations even and unlabored Abdominal:     Comments: Soft, nontender abdomen.  Musculoskeletal:        General: Normal range of motion.     Cervical back: Normal range of motion.  Skin:    General: Skin is warm and dry.     Coloration: Skin is not pale.     Findings: No erythema or rash.  Neurological:     Mental Status: He is alert and oriented to person, place, and time.  Psychiatric:        Behavior: Behavior normal.     ED Results / Procedures / Treatments   Labs (all labs ordered are listed, but only abnormal results are displayed) Labs Reviewed  COMPREHENSIVE  METABOLIC PANEL - Abnormal; Notable for the following components:      Result Value   Sodium 134 (*)    Glucose, Bld 106 (*)    Calcium 8.7 (*)    All other components within normal limits  CBC - Abnormal; Notable for the following components:   WBC 3.7 (*)    All other components within normal limits  URINALYSIS, ROUTINE W REFLEX MICROSCOPIC - Abnormal; Notable for the following components:   Color, Urine STRAW (*)    All other components within normal limits  LIPASE, BLOOD    EKG None  Radiology No results found.  Procedures Procedures (including critical care time)  Medications Ordered in ED Medications - No data to display  ED Course  I have reviewed the triage vital signs and the nursing notes.  Pertinent labs & imaging results that were available during my care of the patient were reviewed by me and considered in my medical decision making (see chart for details).    MDM Rules/Calculators/A&P                       53 year old male presenting to the emergency department for evaluation of reflux and abdominal bloating with indigestion.  He has had symptoms chronically, but reports worsening over the past 2 days.  Symptoms have been unrelieved with Nexium.  Notes that he has had some episodes of spitting up blood, but denies hematemesis, melena, hematochezia.  His hemoglobin today is stable.  Remainder of laboratory work-up is also reassuring.  Plan to start on daily Protonix.  He has been encouraged to follow-up with his primary care doctor.  Referral also provided to gastroenterology should symptoms persist as patient may benefit from further evaluation such as endoscopy.  Counseled on dietary changes.  Return precautions discussed and provided. Patient discharged in stable condition with no unaddressed concerns.   Final Clinical Impression(s) / ED Diagnoses Final diagnoses:  Gastroesophageal reflux disease, unspecified whether esophagitis present    Rx / DC  Orders ED Discharge Orders         Ordered    pantoprazole (PROTONIX) 20 MG tablet  Daily     09/23/19 0208           Antony Madura, PA-C 09/23/19 0227    Dione Booze, MD 09/23/19 [phone]

## 2020-01-02 LAB — HM COLONOSCOPY

## 2020-01-08 DIAGNOSIS — R1013 Epigastric pain: Secondary | ICD-10-CM | POA: Insufficient documentation

## 2020-01-08 DIAGNOSIS — B181 Chronic viral hepatitis B without delta-agent: Secondary | ICD-10-CM | POA: Insufficient documentation

## 2020-05-23 DIAGNOSIS — E782 Mixed hyperlipidemia: Secondary | ICD-10-CM | POA: Insufficient documentation

## 2021-02-26 ENCOUNTER — Telehealth: Payer: Self-pay | Admitting: Hematology and Oncology

## 2021-02-26 NOTE — Telephone Encounter (Signed)
Received a new hem referral from Dr. Julio Sicks for leukopenia. Kurt Ross has been cld and scheduled to see Dr. Al Pimple on 6/6 at 1120am. Pt aware to arrive 20 minutes early.

## 2021-03-02 ENCOUNTER — Other Ambulatory Visit: Payer: Self-pay

## 2021-03-02 ENCOUNTER — Inpatient Hospital Stay: Payer: 59

## 2021-03-02 ENCOUNTER — Inpatient Hospital Stay: Payer: 59 | Attending: Hematology and Oncology | Admitting: Hematology and Oncology

## 2021-03-02 ENCOUNTER — Encounter: Payer: Self-pay | Admitting: Hematology and Oncology

## 2021-03-02 VITALS — BP 162/88 | HR 94 | Temp 98.5°F | Resp 19 | Wt 211.4 lb

## 2021-03-02 DIAGNOSIS — Z885 Allergy status to narcotic agent status: Secondary | ICD-10-CM | POA: Diagnosis not present

## 2021-03-02 DIAGNOSIS — D709 Neutropenia, unspecified: Secondary | ICD-10-CM | POA: Insufficient documentation

## 2021-03-02 DIAGNOSIS — Z79899 Other long term (current) drug therapy: Secondary | ICD-10-CM | POA: Diagnosis not present

## 2021-03-02 DIAGNOSIS — Z880 Allergy status to penicillin: Secondary | ICD-10-CM | POA: Insufficient documentation

## 2021-03-02 DIAGNOSIS — Z886 Allergy status to analgesic agent status: Secondary | ICD-10-CM | POA: Insufficient documentation

## 2021-03-02 DIAGNOSIS — K219 Gastro-esophageal reflux disease without esophagitis: Secondary | ICD-10-CM | POA: Insufficient documentation

## 2021-03-02 DIAGNOSIS — B1910 Unspecified viral hepatitis B without hepatic coma: Secondary | ICD-10-CM | POA: Insufficient documentation

## 2021-03-02 DIAGNOSIS — R059 Cough, unspecified: Secondary | ICD-10-CM | POA: Diagnosis not present

## 2021-03-02 LAB — CMP (CANCER CENTER ONLY)
ALT: 30 U/L (ref 0–44)
AST: 29 U/L (ref 15–41)
Albumin: 3.7 g/dL (ref 3.5–5.0)
Alkaline Phosphatase: 76 U/L (ref 38–126)
Anion gap: 11 (ref 5–15)
BUN: 9 mg/dL (ref 6–20)
CO2: 25 mmol/L (ref 22–32)
Calcium: 9.1 mg/dL (ref 8.9–10.3)
Chloride: 105 mmol/L (ref 98–111)
Creatinine: 1 mg/dL (ref 0.61–1.24)
GFR, Estimated: >60 mL/min (ref >60)
Glucose, Bld: 141 mg/dL — ABNORMAL HIGH (ref 70–99)
Potassium: 3.6 mmol/L (ref 3.5–5.1)
Sodium: 141 mmol/L (ref 135–145)
Total Bilirubin: 0.5 mg/dL (ref 0.3–1.2)
Total Protein: 7.6 g/dL (ref 6.5–8.1)

## 2021-03-02 LAB — CBC WITH DIFFERENTIAL/PLATELET
Abs Immature Granulocytes: 0 10*3/uL (ref 0.00–0.07)
Basophils Absolute: 0 10*3/uL (ref 0.0–0.1)
Basophils Relative: 2 %
Eosinophils Absolute: 0.1 10*3/uL (ref 0.0–0.5)
Eosinophils Relative: 2 %
HCT: 44.9 % (ref 39.0–52.0)
Hemoglobin: 15.2 g/dL (ref 13.0–17.0)
Immature Granulocytes: 0 %
Lymphocytes Relative: 33 %
Lymphs Abs: 0.9 10*3/uL (ref 0.7–4.0)
MCH: 29.1 pg (ref 26.0–34.0)
MCHC: 33.9 g/dL (ref 30.0–36.0)
MCV: 86 fL (ref 80.0–100.0)
Monocytes Absolute: 0.4 10*3/uL (ref 0.1–1.0)
Monocytes Relative: 15 %
Neutro Abs: 1.3 10*3/uL — ABNORMAL LOW (ref 1.7–7.7)
Neutrophils Relative %: 48 %
Platelets: 145 10*3/uL — ABNORMAL LOW (ref 150–400)
RBC: 5.22 MIL/uL (ref 4.22–5.81)
RDW: 12.8 % (ref 11.5–15.5)
WBC: 2.6 10*3/uL — ABNORMAL LOW (ref 4.0–10.5)
nRBC: 0 % (ref 0.0–0.2)

## 2021-03-02 LAB — IRON AND TIBC
Iron: 137 ug/dL (ref 42–163)
Saturation Ratios: 39 % (ref 20–55)
TIBC: 354 ug/dL (ref 202–409)
UIBC: 217 ug/dL (ref 117–376)

## 2021-03-02 LAB — TSH: TSH: 1.186 u[IU]/mL (ref 0.320–4.118)

## 2021-03-02 LAB — FERRITIN: Ferritin: 45 ng/mL (ref 24–336)

## 2021-03-02 LAB — VITAMIN B12: Vitamin B-12: 247 pg/mL (ref 180–914)

## 2021-03-02 NOTE — Progress Notes (Signed)
[facility] CONSULT NOTE  Patient Care Team: Patient, No Pcp Per (Inactive) as PCP - General (General Practice)  CHIEF COMPLAINTS/PURPOSE OF CONSULTATION:  Leukopenia.  ASSESSMENT & PLAN:  Orders Placed This Encounter  Procedures  . CBC with Differential/Platelet    Standing Status:   Standing    Number of Occurrences:   22    Standing Expiration Date:   03/02/2022  . CMP (Cancer Center only)    Standing Status:   Future    Number of Occurrences:   1    Standing Expiration Date:   03/02/2022  . Iron and TIBC    Standing Status:   Future    Number of Occurrences:   1    Standing Expiration Date:   03/02/2022  . Ferritin    Standing Status:   Future    Number of Occurrences:   1    Standing Expiration Date:   03/02/2022  . Vitamin B12    Standing Status:   Future    Number of Occurrences:   1    Standing Expiration Date:   03/02/2022  . Folate RBC    Standing Status:   Future    Number of Occurrences:   1    Standing Expiration Date:   03/02/2022  . Flow Cytometry, Peripheral Blood (Oncology)    Standing Status:   Future    Number of Occurrences:   1    Standing Expiration Date:   03/02/2022  . ANA, IFA (with reflex)    Standing Status:   Future    Number of Occurrences:   1    Standing Expiration Date:   03/02/2022  . 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  Patient denies any new health complaints.  He has had chronic issue with spitting up blood first thing in the morning and has been evaluated by different specialities in the past according to him.  He currently continues on tenofovir For hepatitis B infection and PPIs for GERD. Physical examination unremarkable.  I reviewed labs over the past several years where he  was found to have leukopenia with some neutropenia which might have gotten worse with time.  We have discussed about common causes of neutropenia including benign ethnic neutropenia, infections including viral infections, medications, nutritional deficiencies, autoimmune diseases, thyroid disorders, autoimmune neutropenia and some bone marrow disorders causing neutropenia.  Recommended that we proceed with some basic lab evaluation.  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  I could not find a direct correlation between tenofovir and worsening neutropenia or thrombocytopenia however I wonder if he has underlying liver cirrhosis from hepatitis B which could cause both of these. He will return to clinic in 2 weeks to review labs and to discuss any additional recommendations. If there is any suspicion for cirrhosis, we will have to send him back to his gastroenterologist for further evaluation.  At this time I do not see any clear evidence of aplastic anemia or other bone marrow disorders.  I have however ordered a flow cytometry to look for large granular lymphocyte leukemia, hairy cell leukemia etc.  Thank you for consulting [place] in  the care of this patient.  Please do not hesitate to contact us with any additional questions or concerns.  HISTORY OF PRESENTING ILLNESS:  Kerrigan Glendening 55 y.o. male is here because of Leukopenia.  This is a very pleasant 55 year old male patient with past medical history significant for hepatitis B and GERD followed by gastroenterology on tenofovir referred to hematology for evaluation of worsening leukopenia.  Patient arrived to the appointment today by himself.  He tells me that he he feels very well except for this chronic complaint of spitting up blood every morning when he wakes up and he has been following up with gastroenterology for this.  He has had an endoscopy and colonoscopy which was unremarkable according to the  patient.  Besides this complaint, he denies any B symptoms.  He has been treated for hepatitis B and currently on tenofovir and he has to stay on this for lifetime.  He denies any known autoimmune diseases, nutritional deficiencies, hyper/hypothyroidism, familial neutropenia.  He is originally from [place].  He is working in [employer], denies any smoking, alcohol or other recreational drugs. He cannot quite tell me when he started tenofovir, apparently he was prescribed different drug in the past which he did not take according to the doctor's recommendation. He absolutely denies any complaints.  He feels well overall.  Rest of the pertinent review of systems reviewed and unremarkable.  REVIEW OF SYSTEMS:   Constitutional: Denies fevers, chills or abnormal night sweats Eyes: Denies blurriness of vision, double vision or watery eyes Ears, nose, mouth, throat, and face: Denies mucositis or sore throat Respiratory: Denies cough, dyspnea or wheezes Cardiovascular: Denies palpitation, chest discomfort or lower extremity swelling Gastrointestinal:  Denies nausea, heartburn or change in bowel habits Skin: Denies abnormal skin rashes Lymphatics: Denies new lymphadenopathy or easy bruising Neurological:Denies numbness, tingling or new weaknesses Behavioral/Psych: Mood is stable, no new changes  All other systems were reviewed with the patient and are negative.  MEDICAL HISTORY:  Past Medical History:  Diagnosis Date  . GERD (gastroesophageal reflux disease)   . Hepatitis B     SURGICAL HISTORY: No past surgical history on file.  SOCIAL HISTORY: Social History   Socioeconomic History  . Marital status: Married    Spouse name: Not on file  . Number of children: Not on file  . Years of education: Not on file  . Highest education level: Not on file  Occupational History  . Not on file  Tobacco Use  . Smoking status: Never Smoker  . Smokeless tobacco: Never Used  Substance and  Sexual Activity  . Alcohol use: No  . Drug use: No  . Sexual activity: Not on file  Other Topics Concern  . Not on file  Social History Narrative  . Not on file   Social Determinants of Health   Financial Resource Strain: Not on file  Food Insecurity: Not on file  Transportation Needs: Not on file  Physical Activity: Not on file  Stress: Not on file  Social Connections: Not on file  Intimate Partner Violence: Not on file    FAMILY HISTORY: No family history on file.  ALLERGIES:  is allergic to codeine, nsaids, and penicillins.  MEDICATIONS:  Current Outpatient Medications  Medication Sig Dispense Refill  . ciprofloxacin (CILOXAN) 0.3 % ophthalmic solution Place 2 drops into both eyes every 6 (six) hours. (Patient not taking: No sig reported) 5 mL 0  . entecavir (BARACLUDE) 0.5 MG tablet Take 1 tablet (0.5 mg  total) by mouth daily. (Patient not taking: No sig reported) 30 tablet 11  . pantoprazole (PROTONIX) 20 MG tablet Take 1 tablet (20 mg total) by mouth daily. (Patient taking differently: Take 80 mg by mouth daily.) 30 tablet 1  . polyethylene glycol powder (GLYCOLAX/MIRALAX) powder Take 17 g by mouth daily. (Patient not taking: Reported on 03/02/2021) 850 g 1   No current facility-administered medications for this visit.     PHYSICAL EXAMINATION:  ECOG PERFORMANCE STATUS: 0 - Asymptomatic  Vitals:   03/02/21 1119  BP: (!) 162/88  Pulse: 94  Resp: 19  Temp: 98.5 F (36.9 C)  SpO2: 99%   Filed Weights   03/02/21 1119  Weight: 211 lb 6.4 oz (95.9 kg)    GENERAL:alert, no distress and comfortable SKIN: skin color, texture, turgor are normal, no rashes or significant lesions EYES: normal, conjunctiva are pink and non-injected, sclera clear OROPHARYNX:no exudate, no erythema and lips, buccal mucosa, and tongue normal  NECK: supple, thyroid normal size, non-tender, without nodularity LYMPH:  no palpable lymphadenopathy in the cervical, axillary or  inguinal LUNGS: clear to auscultation and percussion with normal breathing effort HEART: regular rate & rhythm and no murmurs and no lower extremity edema ABDOMEN:abdomen soft, non-tender and normal bowel sounds Musculoskeletal:no cyanosis of digits and no clubbing  PSYCH: alert & oriented x 3 with fluent speech NEURO: no focal motor/sensory deficits  LABORATORY DATA:  I have reviewed the data as listed Lab Results  Component Value Date   WBC 2.6 (L) 03/02/2021   HGB 15.2 03/02/2021   HCT 44.9 03/02/2021   MCV 86.0 03/02/2021   PLT 145 (L) 03/02/2021     Chemistry      Component Value Date/Time   NA 141 03/02/2021 1208   K 3.6 03/02/2021 1208   CL 105 03/02/2021 1208   CO2 25 03/02/2021 1208   BUN 9 03/02/2021 1208   CREATININE 1.00 03/02/2021 1208   CREATININE 0.83 12/24/2014 1136      Component Value Date/Time   CALCIUM 9.1 03/02/2021 1208   ALKPHOS 76 03/02/2021 1208   AST 29 03/02/2021 1208   ALT 30 03/02/2021 1208   BILITOT 0.5 03/02/2021 1208     Have reviewed his labs for the past several years.  He has had leukopenia for the past several years with neutropenia . Leukopenia appears to have gotten slightly worse along with some mild thrombocytopenia that was noted.  RADIOGRAPHIC STUDIES: I have personally reviewed the radiological images as listed and agreed with the findings in the report. No results found.  All questions were answered. The patient knows to call the clinic with any problems, questions or concerns. I spent 45 minutes in the care of this patient including H and P, review of records, counseling and coordination of care. We have discussed about common causes of neutropenia including benign ethnic neutropenia, familial neutropenia, infection, medications, autoimmune diseases, nutritional deficiencies, and bone marrow disorders.    Rachel Moulds, MD 03/02/2021 1:18 PM

## 2021-03-03 LAB — FOLATE RBC
Folate, Hemolysate: 332 ng/mL
Folate, RBC: 738 ng/mL (ref >498)
Hematocrit: 45 % (ref 37.5–51.0)

## 2021-03-04 LAB — SURGICAL PATHOLOGY

## 2021-03-05 LAB — FLOW CYTOMETRY

## 2021-03-06 LAB — ANTINUCLEAR ANTIBODIES, IFA: ANA Ab, IFA: NEGATIVE

## 2021-03-15 ENCOUNTER — Emergency Department (HOSPITAL_COMMUNITY): Payer: 59

## 2021-03-15 ENCOUNTER — Encounter (HOSPITAL_COMMUNITY): Payer: Self-pay | Admitting: Emergency Medicine

## 2021-03-15 ENCOUNTER — Other Ambulatory Visit: Payer: Self-pay

## 2021-03-15 ENCOUNTER — Emergency Department (HOSPITAL_COMMUNITY)
Admission: EM | Admit: 2021-03-15 | Discharge: 2021-03-15 | Disposition: A | Discharge status: Home / Self Care | Payer: 59 | Attending: Emergency Medicine | Admitting: Emergency Medicine

## 2021-03-15 DIAGNOSIS — R042 Hemoptysis: Secondary | ICD-10-CM | POA: Insufficient documentation

## 2021-03-15 NOTE — Discharge Instructions (Addendum)
Your chest x-ray was normal.  It did not show any sign of infection or other abnormality.  Call your primary care doctor or specialist as discussed in the next 2-3 days.   Return immediately back to the ER if:  Your symptoms worsen within the next 12-24 hours. You develop new symptoms such as new fevers, persistent vomiting, new pain, shortness of breath, or new weakness or numbness, or if you have any other concerns.

## 2021-03-15 NOTE — ED Triage Notes (Signed)
Patient has hx of GERD and has been seen by GI x2 years. Patient states that recently he has been developing a cough, and is noticing blood tinged sputum. Patient followed up with doctor, but did not do imaging and stated it was his GERD. Patient states cough is getting more frequent and he is noticing and increase in the amount of blood in the morning.

## 2021-03-15 NOTE — ED Provider Notes (Signed)
[facility]-EMERGENCY DEPT Provider Note   CSN: [id number] Arrival date & time: 03/15/21  2050     History Chief Complaint  Patient presents with   Cough    Blood tinged sputum   Gastroesophageal Reflux    Kurt Ross is a 55 y.o. male.  Presenting with a chronic cough has had for over 2 years he states.  His cough has become blood-tinged sputum over the course of the last year.  He states that nearly every day in the morning he has blood-tinged sputum.  He states he has been following with the primary care doctor but "no x-ray has ever been done."  States that he noticed a bigger fleck of sputum that had big amount of blood this morning and presented to the ER.  Currently denies any hemoptysis at this time, states that it only occurs usually in the mornings.  Denies fevers or vomiting or diarrhea.  Denies shortness of breath or chest pain.  Denies chills or night sweats at home.      Past Medical History:  Diagnosis Date   GERD (gastroesophageal reflux disease)    Hepatitis B     Patient Active Problem List   Diagnosis Date Noted   GERD (gastroesophageal reflux disease) 07/17/2013   Conjunctivitis, acute, right eye 04/05/2013   Other abnormal blood chemistry 03/23/2013    History reviewed. No pertinent surgical history.     No family history on file.  Social History   Tobacco Use   Smoking status: Never   Smokeless tobacco: Never  Substance Use Topics   Alcohol use: No   Drug use: No    Home Medications Prior to Admission medications   Medication Sig Start Date End Date Taking? Authorizing Provider  ciprofloxacin (CILOXAN) 0.3 % ophthalmic solution Place 2 drops into both eyes every 6 (six) hours. Patient not taking: No sig reported 04/06/13   Standley Dakins L, MD  entecavir (BARACLUDE) 0.5 MG tablet Take 1 tablet (0.5 mg total) by mouth daily. Patient not taking: No sig reported 12/24/14   Judyann Munson, MD  pantoprazole (PROTONIX)  20 MG tablet Take 1 tablet (20 mg total) by mouth daily. Patient taking differently: Take 80 mg by mouth daily. 09/23/19   Antony Madura, PA-C  polyethylene glycol powder (GLYCOLAX/MIRALAX) powder Take 17 g by mouth daily. Patient not taking: Reported on 03/02/2021 04/18/15   Ambrose Finland, NP    Allergies    Codeine, Nsaids, and Penicillins  Review of Systems   Review of Systems  Constitutional:  Negative for fever.  HENT:  Negative for ear pain and sore throat.   Eyes:  Negative for pain.  Respiratory:  Positive for cough.   Cardiovascular:  Negative for chest pain.  Gastrointestinal:  Negative for abdominal pain.  Genitourinary:  Negative for flank pain.  Musculoskeletal:  Negative for back pain.  Skin:  Negative for color change and rash.  Neurological:  Negative for syncope.  All other systems reviewed and are negative.  Physical Exam Updated Vital Signs BP (!) 157/79 (BP Location: Left Arm)   Pulse 66   Temp 98.4 F (36.9 C) (Oral)   Resp 18   Ht 6' (1.829 m)   Wt 97.5 kg   SpO2 99%   BMI 29.16 kg/m   Physical Exam Constitutional:      General: He is not in acute distress.    Appearance: He is well-developed.  HENT:     Head: Normocephalic.  Nose: Nose normal.  Eyes:     Extraocular Movements: Extraocular movements intact.  Cardiovascular:     Rate and Rhythm: Normal rate.  Pulmonary:     Effort: Pulmonary effort is normal.  Skin:    Coloration: Skin is not jaundiced.  Neurological:     Mental Status: He is alert. Mental status is at baseline.    ED Results / Procedures / Treatments   Labs (all labs ordered are listed, but only abnormal results are displayed) Labs Reviewed - No data to display  EKG None  Radiology DG Chest 2 View  Result Date: 03/15/2021 CLINICAL DATA:  Cough.  Possible blood tinged sputum. EXAM: CHEST - 2 VIEW COMPARISON:  None. FINDINGS: The cardiomediastinal contours are normal. The lungs are clear. Pulmonary vasculature is  normal. No consolidation, pleural effusion, or pneumothorax. No acute osseous abnormalities are seen. IMPRESSION: No acute chest findings. Electronically Signed   By: Narda Rutherford M.D.   On: 03/15/2021 21:56    Procedures Procedures   Medications Ordered in ED Medications - No data to display  ED Course  I have reviewed the triage vital signs and the nursing notes.  Pertinent labs & imaging results that were available during my care of the patient were reviewed by me and considered in my medical decision making (see chart for details).    MDM Rules/Calculators/A&P                          Chest x-ray is clear no infiltrate effusion or edema noted per radiology.  Patient's vital signs are within normal limits.  Tuberculosis and pulm embolism among the differentials considered however they feel these are unlikely given his history physical exam and vital signs.  Will recommend continued follow-up with his primary care doctor, advised immediate return for worsening symptoms difficulty breathing fevers or any additional concerns.  Final Clinical Impression(s) / ED Diagnoses Final diagnoses:  Hemoptysis    Rx / DC Orders ED Discharge Orders     None        Cheryll Cockayne, MD 03/15/21 2214

## 2021-03-17 ENCOUNTER — Inpatient Hospital Stay (HOSPITAL_BASED_OUTPATIENT_CLINIC_OR_DEPARTMENT_OTHER): Payer: 59 | Admitting: Hematology and Oncology

## 2021-03-17 DIAGNOSIS — D709 Neutropenia, unspecified: Secondary | ICD-10-CM

## 2021-03-17 NOTE — Progress Notes (Signed)
Kurt Ross CONSULT NOTE  Kurt Ross Care Team: Kurt Ross, No Pcp Per (Inactive) as PCP - General (General Practice)  CHIEF COMPLAINTS/PURPOSE OF CONSULTATION:  Leukopenia.  ASSESSMENT & PLAN:   No orders of the defined types were placed in this encounter.  This is a very pleasant 55 year old male Kurt Ross who was referred to hematology for evaluation of neutropenia which has been chronic and longstanding with some worsening in the past year.   During her initial visit, I recommended lab evaluation.  Kurt Ross is completely asymptomatic except for some ongoing gastrointestinal issues and hepatitis B for which she takes tenofovir.  Physical examination on initial consultation was unremarkable.  He is here for telehealth visit hands physical exam not done however he appears to be in no acute distress. We have reviewed all the labs from last visit, ongoing leukopenia but no other abnormalities.  Since he is completely asymptomatic from the leukopenia standpoint and since this is more of a chronic finding although it was progressive, I have recommended surveillance and return to clinic in 3 to 4 months.  He was encouraged to call us with any new questions or concerns or recurrent infections.  Neutropenia has been reported in about 3% of the patients taking tenofovir,  does not appear to be a common side effect. We will continue to monitor these labs when he returns for follow-up in 3 to 4 months.  He already has a scheduled follow-up in September.  Thank you for consulting [place] in the care of this Kurt Ross.  Please do not hesitate to contact us with any additional questions or concerns.  HISTORY OF PRESENTING ILLNESS:  Kurt Ross 55 y.o. male is here because of Leukopenia.  This is a very pleasant 55 year old male Kurt Ross with past medical history significant for hepatitis B and GERD followed by gastroenterology on tenofovir referred to hematology for evaluation of worsening leukopenia.     Interval History  Kurt Ross is here for a telehealth FU. He is doing well, continues to have GI issues, No new fevers or concern for infections. No interim hospitalizations. Rest of the pertinent 10 point ROS reviewed and neg.  MEDICAL HISTORY:  Past Medical History:  Diagnosis Date   GERD (gastroesophageal reflux disease)    Hepatitis B     SURGICAL HISTORY: No past surgical history on file.  SOCIAL HISTORY: Social History   Socioeconomic History   Marital status: Married    Spouse name: Not on file   Number of children: Not on file   Years of education: Not on file   Highest education level: Not on file  Occupational History   Not on file  Tobacco Use   Smoking status: Never   Smokeless tobacco: Never  Substance and Sexual Activity   Alcohol use: No   Drug use: No   Sexual activity: Not on file  Other Topics Concern   Not on file  Social History Narrative   Not on file   Social Determinants of Health   Financial Resource Strain: Not on file  Food Insecurity: Not on file  Transportation Needs: Not on file  Physical Activity: Not on file  Stress: Not on file  Social Connections: Not on file  Intimate Partner Violence: Not on file    FAMILY HISTORY: No family history on file.  ALLERGIES:  is allergic to codeine, nsaids, and penicillins.  MEDICATIONS:  Current Outpatient Medications  Medication Sig Dispense Refill   ciprofloxacin (CILOXAN) 0.3 % ophthalmic solution Place 2 drops  into both eyes every 6 (six) hours. (Kurt Ross not taking: No sig reported) 5 mL 0   entecavir (BARACLUDE) 0.5 MG tablet Take 1 tablet (0.5 mg total) by mouth daily. (Kurt Ross not taking: No sig reported) 30 tablet 11   pantoprazole (PROTONIX) 20 MG tablet Take 1 tablet (20 mg total) by mouth daily. (Kurt Ross taking differently: Take 80 mg by mouth daily.) 30 tablet 1   polyethylene glycol powder (GLYCOLAX/MIRALAX) powder Take 17 g by mouth daily. (Kurt Ross not taking: Reported on  03/02/2021) 850 g 1   No current facility-administered medications for this visit.     PHYSICAL EXAMINATION:  ECOG PERFORMANCE STATUS: 0 - Asymptomatic  V/S and PE not done, telehealth visit.  LABORATORY DATA:  I have reviewed the data as listed Lab Results  Component Value Date   WBC 2.6 (L) 03/02/2021   HGB 15.2 03/02/2021   HCT 45.0 03/02/2021   MCV 86.0 03/02/2021   PLT 145 (L) 03/02/2021     Chemistry      Component Value Date/Time   NA 141 03/02/2021 1208   K 3.6 03/02/2021 1208   CL 105 03/02/2021 1208   CO2 25 03/02/2021 1208   BUN 9 03/02/2021 1208   CREATININE 1.00 03/02/2021 1208   CREATININE 0.83 12/24/2014 1136      Component Value Date/Time   CALCIUM 9.1 03/02/2021 1208   ALKPHOS 76 03/02/2021 1208   AST 29 03/02/2021 1208   ALT 30 03/02/2021 1208   BILITOT 0.5 03/02/2021 1208     I have reviewed his labs.  CBC showed white blood cell count of 2600, platelet count of 145,000 and absolute neutrophil count of 1300. CMP showed normal creatinine.  No elevated liver function tests. Ferritin is normal at 45.  B12 and folic acid levels are normal. ANA is normal.  Flow negative TSH normal  RADIOGRAPHIC STUDIES: I have personally reviewed the radiological images as listed and agreed with the findings in the report. DG Chest 2 View  Result Date: 03/15/2021 CLINICAL DATA:  Cough.  Possible blood tinged sputum. EXAM: CHEST - 2 VIEW COMPARISON:  None. FINDINGS: The cardiomediastinal contours are normal. The lungs are clear. Pulmonary vasculature is normal. No consolidation, pleural effusion, or pneumothorax. No acute osseous abnormalities are seen. IMPRESSION: No acute chest findings. Electronically Signed   By: Narda Rutherford M.D.   On: 03/15/2021 21:56    All questions were answered. The Kurt Ross knows to call the clinic with any problems, questions or concerns. I spent 20 minutes in the care of this Kurt Ross including history, review of records, counseling and  coordination of care. We have reviewed lab results and discussed about follow-up and surveillance recommendations.  I connected with  [name] on 03/18/21 by a video enabled telemedicine application and verified that I am speaking with the correct person using two identifiers.   I discussed the limitations of evaluation and management by telemedicine. The Kurt Ross expressed understanding and agreed to proceed.   Rachel Moulds, MD 03/18/2021 9:59 AM

## 2021-03-18 ENCOUNTER — Encounter: Payer: Self-pay | Admitting: Hematology and Oncology

## 2021-03-18 DIAGNOSIS — D72819 Decreased white blood cell count, unspecified: Secondary | ICD-10-CM | POA: Insufficient documentation

## 2021-05-22 ENCOUNTER — Telehealth: Payer: Self-pay | Admitting: Hematology and Oncology

## 2021-05-22 NOTE — Telephone Encounter (Signed)
Called patient regarding September appointments, patient is notified.  

## 2021-06-02 NOTE — Progress Notes (Signed)
[facility] CONSULT NOTE  Patient Care Team: Patient, No Pcp Per (Inactive) as PCP - General (General Practice)  CHIEF COMPLAINTS/PURPOSE OF CONSULTATION:  Leukopenia.  ASSESSMENT & PLAN:   No orders of the defined types were placed in this encounter.  This is a very pleasant 55 year old male patient who was referred to hematology for evaluation of neutropenia which has been chronic and longstanding with some worsening in the past year.   Patient is completely asymptomatic except for some ongoing gastrointestinal issues and hepatitis B for which he takes tenofovir. Since he is completely asymptomatic from the leukopenia standpoint and since this is more of a chronic finding although it was progressive, I have recommended surveillance and return to clinic in 3 to 4 months.   He is here for a FU.  Since last visit, no new health complaints.  He has been feeling well. Physical examination unremarkable, no lymphadenopathy or hepatosplenomegaly.  CBC today showed improvement in leukopenia, absolute neutropenia with ANC count of 4000. This could be related to his viral hepatitis or rarely from tenofovir.  Since he remains clinically asymptomatic with no other concerns, I believe it is reasonable to monitor him for follow-up in 6 months with repeat labs.  Thank you for consulting [place] in the care of this patient.  Please do not hesitate to contact us with any additional questions or concerns.  HISTORY OF PRESENTING ILLNESS:  Kurt Ross 55 y.o. male is here because of Leukopenia.  This is a very pleasant 55 year old male patient with past medical history significant for hepatitis B and GERD followed by gastroenterology on tenofovir referred to hematology for evaluation of worsening leukopenia.    Interval History  This is a very pleasant 55 year old male with hepatitis B on tenofovir who is here for follow-up regarding his leukopenia.  Since his last visit, he has been doing really  well.  No B symptoms.  No interim infections or hospitalizations.  Breathing has been good.  Some occasional constipation.  No change in urinary habits.  No new neurological complaints.  MEDICAL HISTORY:  Past Medical History:  Diagnosis Date   GERD (gastroesophageal reflux disease)    Hepatitis B     SURGICAL HISTORY: No past surgical history on file.  SOCIAL HISTORY: Social History   Socioeconomic History   Marital status: Married    Spouse name: Not on file   Number of children: Not on file   Years of education: Not on file   Highest education level: Not on file  Occupational History   Not on file  Tobacco Use   Smoking status: Never   Smokeless tobacco: Never  Substance and Sexual Activity   Alcohol use: No   Drug use: No   Sexual activity: Not on file  Other Topics Concern   Not on file  Social History Narrative   Not on file   Social Determinants of Health   Financial Resource Strain: Not on file  Food Insecurity: Not on file  Transportation Needs: Not on file  Physical Activity: Not on file  Stress: Not on file  Social Connections: Not on file  Intimate Partner Violence: Not on file    FAMILY HISTORY: No family history on file.  ALLERGIES:  is allergic to codeine, nsaids, and penicillins.  MEDICATIONS:  Current Outpatient Medications  Medication Sig Dispense Refill   ciprofloxacin (CILOXAN) 0.3 % ophthalmic solution Place 2 drops into both eyes every 6 (six) hours. (Patient not taking: No sig reported) 5  mL 0   entecavir (BARACLUDE) 0.5 MG tablet Take 1 tablet (0.5 mg total) by mouth daily. (Patient not taking: No sig reported) 30 tablet 11   pantoprazole (PROTONIX) 20 MG tablet Take 1 tablet (20 mg total) by mouth daily. (Patient taking differently: Take 80 mg by mouth daily.) 30 tablet 1   polyethylene glycol powder (GLYCOLAX/MIRALAX) powder Take 17 g by mouth daily. (Patient not taking: Reported on 03/02/2021) 850 g 1   No current  facility-administered medications for this visit.     PHYSICAL EXAMINATION:  ECOG PERFORMANCE STATUS: 0 - Asymptomatic Physical Exam Constitutional:      Appearance: Normal appearance.  HENT:     Head: Normocephalic and atraumatic.  Cardiovascular:     Rate and Rhythm: Normal rate and regular rhythm.     Pulses: Normal pulses.     Heart sounds: Normal heart sounds.  Pulmonary:     Effort: Pulmonary effort is normal.     Breath sounds: Normal breath sounds.  Abdominal:     General: Abdomen is flat.     Palpations: Abdomen is soft.  Musculoskeletal:        General: No swelling or tenderness.     Cervical back: Normal range of motion and neck supple. No rigidity.  Lymphadenopathy:     Cervical: No cervical adenopathy.  Skin:    General: Skin is warm and dry.  Neurological:     General: No focal deficit present.     Mental Status: He is alert.  Psychiatric:        Mood and Affect: Mood normal.     LABORATORY DATA:  I have reviewed the data as listed Lab Results  Component Value Date   WBC 2.6 (L) 03/02/2021   HGB 15.2 03/02/2021   HCT 45.0 03/02/2021   MCV 86.0 03/02/2021   PLT 145 (L) 03/02/2021     Chemistry      Component Value Date/Time   NA 141 03/02/2021 1208   K 3.6 03/02/2021 1208   CL 105 03/02/2021 1208   CO2 25 03/02/2021 1208   BUN 9 03/02/2021 1208   CREATININE 1.00 03/02/2021 1208   CREATININE 0.83 12/24/2014 1136      Component Value Date/Time   CALCIUM 9.1 03/02/2021 1208   ALKPHOS 76 03/02/2021 1208   AST 29 03/02/2021 1208   ALT 30 03/02/2021 1208   BILITOT 0.5 03/02/2021 1208     I have reviewed his labs.  RADIOGRAPHIC STUDIES: I have personally reviewed the radiological images as listed and agreed with the findings in the report. No results found.  All questions were answered. The patient knows to call the clinic with any problems, questions or concerns. I spent 20 minutes in the care of this patient including history, review of  records, counseling and coordination of care. We have reviewed lab results and discussed about follow-up and surveillance recommendations.   Rachel Moulds, MD 06/02/2021 3:10 PM

## 2021-06-03 ENCOUNTER — Encounter: Payer: Self-pay | Admitting: Hematology and Oncology

## 2021-06-03 ENCOUNTER — Inpatient Hospital Stay: Payer: 59 | Attending: Hematology and Oncology

## 2021-06-03 ENCOUNTER — Inpatient Hospital Stay (HOSPITAL_BASED_OUTPATIENT_CLINIC_OR_DEPARTMENT_OTHER): Payer: 59 | Admitting: Hematology and Oncology

## 2021-06-03 ENCOUNTER — Other Ambulatory Visit: Payer: Self-pay

## 2021-06-03 VITALS — BP 157/70 | HR 71 | Temp 98.3°F | Resp 18 | Wt 213.0 lb

## 2021-06-03 DIAGNOSIS — Z886 Allergy status to analgesic agent status: Secondary | ICD-10-CM | POA: Diagnosis not present

## 2021-06-03 DIAGNOSIS — K219 Gastro-esophageal reflux disease without esophagitis: Secondary | ICD-10-CM | POA: Diagnosis not present

## 2021-06-03 DIAGNOSIS — B1910 Unspecified viral hepatitis B without hepatic coma: Secondary | ICD-10-CM | POA: Diagnosis not present

## 2021-06-03 DIAGNOSIS — D709 Neutropenia, unspecified: Secondary | ICD-10-CM | POA: Insufficient documentation

## 2021-06-03 DIAGNOSIS — Z880 Allergy status to penicillin: Secondary | ICD-10-CM | POA: Insufficient documentation

## 2021-06-03 DIAGNOSIS — Z885 Allergy status to narcotic agent status: Secondary | ICD-10-CM | POA: Insufficient documentation

## 2021-06-03 DIAGNOSIS — K5900 Constipation, unspecified: Secondary | ICD-10-CM | POA: Diagnosis not present

## 2021-06-03 LAB — CBC WITH DIFFERENTIAL/PLATELET
Abs Immature Granulocytes: 0 10*3/uL (ref 0.00–0.07)
Basophils Absolute: 0.1 10*3/uL (ref 0.0–0.1)
Basophils Relative: 2 %
Eosinophils Absolute: 0.2 10*3/uL (ref 0.0–0.5)
Eosinophils Relative: 6 %
HCT: 44.3 % (ref 39.0–52.0)
Hemoglobin: 15.3 g/dL (ref 13.0–17.0)
Immature Granulocytes: 0 %
Lymphocytes Relative: 41 %
Lymphs Abs: 1.3 10*3/uL (ref 0.7–4.0)
MCH: 29.2 pg (ref 26.0–34.0)
MCHC: 34.5 g/dL (ref 30.0–36.0)
MCV: 84.5 fL (ref 80.0–100.0)
Monocytes Absolute: 0.6 10*3/uL (ref 0.1–1.0)
Monocytes Relative: 19 %
Neutro Abs: 1 10*3/uL — ABNORMAL LOW (ref 1.7–7.7)
Neutrophils Relative %: 32 %
Platelets: 159 10*3/uL (ref 150–400)
RBC: 5.24 MIL/uL (ref 4.22–5.81)
RDW: 13.2 % (ref 11.5–15.5)
WBC: 3.1 10*3/uL — ABNORMAL LOW (ref 4.0–10.5)
nRBC: 0 % (ref 0.0–0.2)

## 2021-06-03 LAB — TSH: TSH: 0.825 u[IU]/mL (ref 0.320–4.118)

## 2021-06-16 ENCOUNTER — Telehealth: Payer: Self-pay

## 2021-06-16 NOTE — Telephone Encounter (Signed)
Spoke with patient regarding his B12 supplementation. Encouraged patient to continue taking medication. Verbalized understanding.

## 2021-11-25 ENCOUNTER — Telehealth: Payer: Self-pay | Admitting: Hematology and Oncology

## 2021-11-25 NOTE — Telephone Encounter (Signed)
R/s pt's appt. Spoke to pt and did offer him an appt on the same day, just a later time. Pt was unable to make a later time. R/s to next available that worked for pt's schedule.  ?

## 2021-12-09 ENCOUNTER — Other Ambulatory Visit: Payer: 59

## 2021-12-09 ENCOUNTER — Ambulatory Visit: Payer: 59 | Admitting: Hematology and Oncology

## 2021-12-09 DIAGNOSIS — I10 Essential (primary) hypertension: Secondary | ICD-10-CM | POA: Insufficient documentation

## 2021-12-09 DIAGNOSIS — F5101 Primary insomnia: Secondary | ICD-10-CM | POA: Insufficient documentation

## 2021-12-11 ENCOUNTER — Ambulatory Visit: Payer: Self-pay | Admitting: Hematology and Oncology

## 2021-12-11 ENCOUNTER — Other Ambulatory Visit: Payer: Self-pay

## 2021-12-28 ENCOUNTER — Ambulatory Visit: Payer: Self-pay | Admitting: Hematology and Oncology

## 2021-12-28 ENCOUNTER — Other Ambulatory Visit: Payer: Self-pay

## 2022-02-24 ENCOUNTER — Inpatient Hospital Stay: Payer: Self-pay | Admitting: Hematology and Oncology

## 2022-02-24 ENCOUNTER — Inpatient Hospital Stay: Payer: Self-pay

## 2022-04-06 ENCOUNTER — Other Ambulatory Visit: Payer: Self-pay

## 2022-04-06 ENCOUNTER — Telehealth: Payer: Self-pay | Admitting: Hematology

## 2022-04-06 ENCOUNTER — Inpatient Hospital Stay: Payer: Self-pay

## 2022-04-06 ENCOUNTER — Other Ambulatory Visit: Payer: Self-pay | Admitting: Specialist

## 2022-04-06 ENCOUNTER — Inpatient Hospital Stay: Payer: Self-pay | Admitting: Hematology and Oncology

## 2022-04-06 DIAGNOSIS — K7460 Unspecified cirrhosis of liver: Secondary | ICD-10-CM

## 2022-04-06 DIAGNOSIS — K769 Liver disease, unspecified: Secondary | ICD-10-CM

## 2022-04-06 NOTE — Telephone Encounter (Signed)
Per 7/11 phone line pt called to cancel appointment.  Appointment was canceled per his request and nurse was notified

## 2022-04-27 ENCOUNTER — Ambulatory Visit (INDEPENDENT_AMBULATORY_CARE_PROVIDER_SITE_OTHER): Payer: Commercial Managed Care - HMO

## 2022-04-27 DIAGNOSIS — K7460 Unspecified cirrhosis of liver: Secondary | ICD-10-CM | POA: Diagnosis not present

## 2022-04-27 DIAGNOSIS — K769 Liver disease, unspecified: Secondary | ICD-10-CM

## 2022-06-08 ENCOUNTER — Emergency Department (HOSPITAL_COMMUNITY)
Admission: EM | Admit: 2022-06-08 | Discharge: 2022-06-08 | Disposition: A | Discharge status: Home / Self Care | Payer: Commercial Managed Care - HMO | Attending: Emergency Medicine | Admitting: Emergency Medicine

## 2022-06-08 ENCOUNTER — Other Ambulatory Visit: Payer: Self-pay

## 2022-06-08 ENCOUNTER — Emergency Department (HOSPITAL_COMMUNITY): Payer: Commercial Managed Care - HMO

## 2022-06-08 ENCOUNTER — Encounter (HOSPITAL_COMMUNITY): Payer: Self-pay

## 2022-06-08 DIAGNOSIS — Z7982 Long term (current) use of aspirin: Secondary | ICD-10-CM | POA: Diagnosis not present

## 2022-06-08 DIAGNOSIS — R079 Chest pain, unspecified: Secondary | ICD-10-CM | POA: Insufficient documentation

## 2022-06-08 DIAGNOSIS — M25532 Pain in left wrist: Secondary | ICD-10-CM | POA: Insufficient documentation

## 2022-06-08 LAB — BASIC METABOLIC PANEL
Anion gap: 8 (ref 5–15)
BUN: 10 mg/dL (ref 6–20)
CO2: 23 mmol/L (ref 22–32)
Calcium: 8.6 mg/dL — ABNORMAL LOW (ref 8.9–10.3)
Chloride: 106 mmol/L (ref 98–111)
Creatinine, Ser: 0.85 mg/dL (ref 0.61–1.24)
GFR, Estimated: >60 mL/min (ref >60)
Glucose, Bld: 164 mg/dL — ABNORMAL HIGH (ref 70–99)
Potassium: 3.8 mmol/L (ref 3.5–5.1)
Sodium: 137 mmol/L (ref 135–145)

## 2022-06-08 LAB — CBC
HCT: 43.8 % (ref 39.0–52.0)
Hemoglobin: 15.1 g/dL (ref 13.0–17.0)
MCH: 29.9 pg (ref 26.0–34.0)
MCHC: 34.5 g/dL (ref 30.0–36.0)
MCV: 86.7 fL (ref 80.0–100.0)
Platelets: 169 10*3/uL (ref 150–400)
RBC: 5.05 MIL/uL (ref 4.22–5.81)
RDW: 13.2 % (ref 11.5–15.5)
WBC: 6.1 10*3/uL (ref 4.0–10.5)
nRBC: 0 % (ref 0.0–0.2)

## 2022-06-08 LAB — TROPONIN I (HIGH SENSITIVITY): Troponin I (High Sensitivity): 6 ng/L (ref <18)

## 2022-06-08 MED ORDER — IBUPROFEN 800 MG PO TABS: 800.0000 mg | ORAL_TABLET | Freq: Three times a day (TID) | ORAL | 0 refills | Status: DC

## 2022-06-08 MED ORDER — IBUPROFEN 800 MG PO TABS
800.0000 mg | ORAL_TABLET | Freq: Once | ORAL | Status: AC
  Administered 2022-06-08: 800 mg via ORAL
  Filled 2022-06-08: qty 1

## 2022-06-08 NOTE — ED Triage Notes (Signed)
[facility] from [facility] for left wrist pain, while he was there he starting having chest pain. Chest pain described as pressure when taking a deep breath. Upon EMS arrival pt has no pain in chest but still has left wrist pain. Pt arrives to ED denying chest pain.  EMS VSS, EKG NSR

## 2022-06-08 NOTE — Discharge Instructions (Addendum)
Your lab work and x-rays are normal today.  I am glad the ibuprofen helped you.  This is something you may buy over-the-counter but I have also written a prescription that you may take to fill at any pharmacy.  I also suggest wearing your brace throughout the day for further support.  I also would sleep in this brace to avoid any injuries while sleeping.  Follow-up with your primary care provider as needed if you continue to have problems with your wrist.  I have some offices on here that you may call to establish care.  It is also a good idea to have a primary care doctor for annual physicals and lab work.  It was a pleasure to meet you and we hope you feel better!

## 2022-06-08 NOTE — Progress Notes (Signed)
Orthopedic Tech Progress Note Patient Details:  Kurt Ross [birth year] [id number]  Ortho Devices Type of Ortho Device: Thumb velcro splint Ortho Device/Splint Location: lue Ortho Device/Splint Interventions: Ordered, Application, Adjustment   Post Interventions Patient Tolerated: Well  Al Decant 06/08/2022, 9:33 PM

## 2022-06-08 NOTE — ED Provider Notes (Signed)
[facility] EMERGENCY DEPARTMENT Provider Note   CSN: [id number] Arrival date & time: 06/08/22  1732     History  Chief Complaint  Patient presents with   Wrist Pain   Chest Pain    Kurt Ross is a 56 y.o. male with a past medical history hepatitis B and GERD presenting today with left wrist pain.  He reports that he was seen at urgent care for this and diagnosed with carpal tunnel.  He was given medications that helped him but then he started to have pain again yesterday.  He went to urgent care today and said that while he was there he felt a sharp pain go from his left wrist up into his chest.  He was given aspirin which she said resolved his symptoms.  Says he is not concerned about his chest pain, most concerned about his wrist.  No numbness or tingling.  Wrist pain is worse with any type of movement but does hurt with rest as well.   Wrist Pain Pertinent negatives include no chest pain.  Chest Pain Associated symptoms: no fever        Home Medications Prior to Admission medications   Medication Sig Start Date End Date Taking? Authorizing Provider  ciprofloxacin (CILOXAN) 0.3 % ophthalmic solution Place 2 drops into both eyes every 6 (six) hours. Patient not taking: No sig reported 04/06/13   Standley Dakins L, MD  entecavir (BARACLUDE) 0.5 MG tablet Take 1 tablet (0.5 mg total) by mouth daily. Patient not taking: No sig reported 12/24/14   Judyann Munson, MD  pantoprazole (PROTONIX) 20 MG tablet Take 1 tablet (20 mg total) by mouth daily. Patient taking differently: Take 80 mg by mouth daily. 09/23/19   Antony Madura, PA-C  polyethylene glycol powder (GLYCOLAX/MIRALAX) powder Take 17 g by mouth daily. Patient not taking: No sig reported 04/18/15   Holland Commons A, NP  VEMLIDY 25 MG TABS Take 1 tablet by mouth daily. 05/18/21   [provider]  vitamin B-12 (CYANOCOBALAMIN) 1000 MCG tablet Take 1,000 mcg by mouth daily.    [provider]      Allergies    Codeine, Nsaids, and Penicillins    Review of Systems   Review of Systems  Constitutional:  Negative for chills and fever.  Cardiovascular:  Negative for chest pain.  Musculoskeletal:  Positive for arthralgias. Negative for joint swelling.  Skin:  Negative for wound.    Physical Exam Updated Vital Signs BP 137/69 (BP Location: Right Arm)   Pulse 75   Temp 98.7 F (37.1 C) (Oral)   Resp 17   Ht 6' (1.829 m)   Wt 96.6 kg   SpO2 99%   BMI 28.88 kg/m  Physical Exam Vitals and nursing note reviewed.  Constitutional:      Appearance: Normal appearance.  HENT:     Head: Normocephalic and atraumatic.  Eyes:     General: No scleral icterus.    Conjunctiva/sclera: Conjunctivae normal.  Cardiovascular:     Rate and Rhythm: Normal rate and regular rhythm.  Pulmonary:     Effort: Pulmonary effort is normal. No respiratory distress.  Musculoskeletal:     Comments: Full range of motion of left wrist.  Strong radial pulse.  Sensation intact.  No signs of cellulitis and no deformities on physical exam.  Pain is worse with wrist flexion, extension, radial and ulnar deviation.  Skin:    General: Skin is warm and dry.  Findings: No rash.  Neurological:     Mental Status: He is alert.  Psychiatric:        Mood and Affect: Mood normal.     ED Results / Procedures / Treatments   Labs (all labs ordered are listed, but only abnormal results are displayed) Labs Reviewed - No data to display  EKG None  Radiology No results found.  Procedures Procedures   Medications Ordered in ED Medications  ibuprofen (ADVIL) tablet 800 mg (800 mg Oral Given 06/08/22 1844)    ED Course/ Medical Decision Making/ A&P                           Medical Decision Making Amount and/or Complexity of Data Reviewed Labs: ordered. Radiology: ordered.  Risk Prescription drug management.   This is a PMH of GERD who presents to the ED for concern of left wrist pain.   Differential includes but is not limited to arthritis, septic arthritis, carpal tunnel syndrome, fracture, dislocation.  Also had an episode of chest pain in urgent care.  Differential includes but is not limited to ACS, PE, dissection, angina, muscle spasm and anxiety.   This is not an exhaustive differential.    Past Medical History / Co-morbidities / Social History: History of hepatitis B and GERD   Additional history: I reviewed patient's previous urgent care visit.  He was seen in outside facility and sent here due to an abnormal EKG and complaints of shortness of breath.  They were concerned for ACS.  They gave him aspirin upon departure.   Physical Exam: Pertinent physical exam findings include Left wrist tender to palpation and worse with any movement.  Neurovascularly intact Normal auscultation  Lab Tests: I ordered, and personally interpreted labs.  The pertinent results include: Negative troponin    Imaging Studies: I ordered and independently visualized and interpreted chest x-ray and wrist x-ray and I agree with the radiologist that there are no acute findings on either.   Cardiac Monitoring:  The patient was maintained on a cardiac monitor.  My attending physician Rubin Payor viewed and interpreted the cardiac monitored which showed an underlying rhythm of: NSR   Medications: I ordered medication including ibuprofen. Reevaluation of the patient after these medicines showed that the patient resolved. I have reviewed the patients home medicines and have made adjustments as needed.   MDM/Disposition: This is a 56 year old male presenting with left wrist pain.  He also mention chest pain while at the urgent care.  His EKG showed mild inferior lead ST elevation.  He is asymptomatic at this time.  Negative troponin.  Heart score 3.  Does not require further work-up at this time.  Radiograph was obtained of patient's left wrist which was negative.  He was given ibuprofen which  resolved his pain in the department.  I discharged him home with 800 mg ibuprofen but he was also informed that he may buy this over-the-counter.  At this time patient does not appear to have any condition requiring emergent intervention today.  He may follow-up with the PCP outpatient.  Neurovascularly intact and agreeable to the plan.   Final Clinical Impression(s) / ED Diagnoses Final diagnoses:  Left wrist pain    Rx / DC Orders ED Discharge Orders          Ordered    ibuprofen (ADVIL) 800 MG tablet  3 times daily        06/08/22 2125  Ambulatory referral to [facility]        06/08/22 2127           Results and diagnoses were explained to the patient. Return precautions discussed in full. Patient had no additional questions and expressed complete understanding.   This chart was dictated using voice recognition software.  Despite best efforts to proofread,  errors can occur which can change the documentation meaning.    Woodroe Chen 06/08/22 2135    Benjiman Core, MD 06/09/22 1445

## 2022-06-11 ENCOUNTER — Ambulatory Visit
Admission: EM | Admit: 2022-06-11 | Discharge: 2022-06-11 | Disposition: A | Discharge status: Home / Self Care | Payer: Commercial Managed Care - HMO | Attending: Physician Assistant | Admitting: Physician Assistant

## 2022-06-11 DIAGNOSIS — J069 Acute upper respiratory infection, unspecified: Secondary | ICD-10-CM

## 2022-06-11 DIAGNOSIS — Z1152 Encounter for screening for COVID-19: Secondary | ICD-10-CM | POA: Diagnosis not present

## 2022-06-11 NOTE — ED Triage Notes (Signed)
[facility] with [facility] of cough, congestion, chills, and body aches for 2 days  Pt reports at home neg covid test, pt reports taking otc cold and cough medications.

## 2022-06-11 NOTE — ED Provider Notes (Signed)
[facility]    CSN: [id number] Arrival date & time: 06/11/22  1817      History   Chief Complaint Chief Complaint  Patient presents with   Cough   Chills    Body aches      HPI Kurt Ross is a 56 y.o. male.   Patient here today for evaluation of cough, congestion, chills and body aches he has had for 2 days.  He reports that he took an at home COVID test that was negative.  He has been taking over-the-counter medication and Benadryl with mild relief of symptoms.  He denies any nausea, vomiting or diarrhea.   The history is provided by the patient.    Past Medical History:  Diagnosis Date   GERD (gastroesophageal reflux disease)    Hepatitis B     Patient Active Problem List   Diagnosis Date Noted   Leukopenia 03/18/2021   GERD (gastroesophageal reflux disease) 07/17/2013   Conjunctivitis, acute, right eye 04/05/2013   Other abnormal blood chemistry 03/23/2013    History reviewed. No pertinent surgical history.     Home Medications    Prior to Admission medications   Medication Sig Start Date End Date Taking? Authorizing Provider  ciprofloxacin (CILOXAN) 0.3 % ophthalmic solution Place 2 drops into both eyes every 6 (six) hours. Patient not taking: No sig reported 04/06/13   Standley Dakins L, MD  entecavir (BARACLUDE) 0.5 MG tablet Take 1 tablet (0.5 mg total) by mouth daily. Patient not taking: No sig reported 12/24/14   Judyann Munson, MD  ibuprofen (ADVIL) 800 MG tablet Take 1 tablet (800 mg total) by mouth 3 (three) times daily. 06/08/22   Redwine, Madison A, PA-C  pantoprazole (PROTONIX) 20 MG tablet Take 1 tablet (20 mg total) by mouth daily. Patient taking differently: Take 80 mg by mouth daily. 09/23/19   Antony Madura, PA-C  polyethylene glycol powder (GLYCOLAX/MIRALAX) powder Take 17 g by mouth daily. Patient not taking: No sig reported 04/18/15   Holland Commons A, NP  VEMLIDY 25 MG TABS Take 1 tablet by mouth daily. 05/18/21   [provider]  vitamin B-12 (CYANOCOBALAMIN) 1000 MCG tablet Take 1,000 mcg by mouth daily.    [provider]    Family History History reviewed. No pertinent family history.  Social History Social History   Tobacco Use   Smoking status: Never   Smokeless tobacco: Never  Substance Use Topics   Alcohol use: No   Drug use: No     Allergies   Codeine, Nsaids, and Penicillins   Review of Systems Review of Systems  Constitutional:  Positive for chills. Negative for fever.  HENT:  Positive for congestion. Negative for ear pain and sore throat.   Eyes:  Negative for discharge and redness.  Respiratory:  Positive for cough. Negative for shortness of breath.   Gastrointestinal:  Negative for abdominal pain, diarrhea, nausea and vomiting.  Musculoskeletal:  Positive for myalgias.     Physical Exam Triage Vital Signs ED Triage Vitals  Enc Vitals Group     BP      Pulse      Resp      Temp      Temp src      SpO2      Weight      Height      Head Circumference      Peak Flow      Pain Score      Pain Loc  Pain Edu?      Excl. in GC?    No data found.  Updated Vital Signs BP (!) 168/106   Pulse 79   Temp 98.6 F (37 C)   Resp 18   SpO2 98%     Physical Exam Vitals and nursing note reviewed.  Constitutional:      General: He is not in acute distress.    Appearance: Normal appearance. He is not ill-appearing.  HENT:     Head: Normocephalic and atraumatic.     Right Ear: Tympanic membrane normal.     Left Ear: Tympanic membrane normal.     Nose: Nose normal. No congestion.     Mouth/Throat:     Mouth: Mucous membranes are moist.     Pharynx: Oropharynx is clear. No oropharyngeal exudate or posterior oropharyngeal erythema.  Eyes:     Conjunctiva/sclera: Conjunctivae normal.  Cardiovascular:     Rate and Rhythm: Normal rate and regular rhythm.     Heart sounds: Normal heart sounds. No murmur heard. Pulmonary:     Effort: Pulmonary  effort is normal. No respiratory distress.     Breath sounds: Normal breath sounds. No wheezing, rhonchi or rales.  Skin:    General: Skin is warm and dry.  Neurological:     Mental Status: He is alert.  Psychiatric:        Mood and Affect: Mood normal.        Thought Content: Thought content normal.      UC Treatments / Results  Labs (all labs ordered are listed, but only abnormal results are displayed) Labs Reviewed  RESP PANEL BY RT-PCR (FLU A&B, COVID) [id number]    EKG   Radiology No results found.  Procedures Procedures (including critical care time)  Medications Ordered in UC Medications - No data to display  Initial Impression / Assessment and Plan / UC Course  I have reviewed the triage vital signs and the nursing notes.  Pertinent labs & imaging results that were available during my care of the patient were reviewed by me and considered in my medical decision making (see chart for details).    Suspect viral etiology of symptoms.  Will screen for COVID and flu.  Recommended symptomatic treatment while awaiting results and follow-up with any further concerns.  Final Clinical Impressions(s) / UC Diagnoses   Final diagnoses:  Acute upper respiratory infection  Encounter for screening for COVID-19   Discharge Instructions   None    ED Prescriptions   None    PDMP not reviewed this encounter.   Tomi Bamberger, PA-C 06/11/22 1924

## 2022-06-12 LAB — RESP PANEL BY RT-PCR (FLU A&B, COVID) ARPGX2
Influenza A by PCR: NEGATIVE
Influenza B by PCR: NEGATIVE
SARS Coronavirus 2 by RT PCR: NEGATIVE

## 2022-08-16 ENCOUNTER — Ambulatory Visit (INDEPENDENT_AMBULATORY_CARE_PROVIDER_SITE_OTHER): Payer: Commercial Managed Care - HMO | Admitting: Family Medicine

## 2022-08-16 ENCOUNTER — Encounter: Payer: Self-pay | Admitting: Family Medicine

## 2022-08-16 VITALS — BP 138/74 | HR 65 | Temp 97.9°F | Ht 73.0 in | Wt 218.2 lb

## 2022-08-16 DIAGNOSIS — Z7689 Persons encountering health services in other specified circumstances: Secondary | ICD-10-CM | POA: Diagnosis not present

## 2022-08-16 DIAGNOSIS — B181 Chronic viral hepatitis B without delta-agent: Secondary | ICD-10-CM | POA: Diagnosis not present

## 2022-08-16 NOTE — Patient Instructions (Signed)
Thank you for choosing [facility] at [facility] for your Primary Care needs. I am excited for the opportunity to partner with you to meet your health care goals. It was a pleasure meeting you today!   Information on diet, exercise, and health maintenance recommendations are listed below. This is information to help you be sure you are on track for optimal health and monitoring.   Please look over this and let us know if you have any questions or if you have completed any of the health maintenance outside of [facility] so that we can be sure your records are up to date.  ___________________________________________________________  MyChart:  For all urgent or time sensitive needs we ask that you please call the office to avoid delays. Our number is [phone]. MyChart is not constantly monitored and due to the large volume of messages a day, replies may take up to 72 business hours.  MyChart Policy: MyChart allows for you to see your visit notes, after visit summary, provider recommendations, lab and tests results, make an appointment, request refills, and contact your provider or the office for non-urgent questions or concerns. Providers are seeing patients during normal business hours and do not have built in time to review MyChart messages.  We ask that you allow a minimum of 3 business days for responses to MyChart messages. For this reason, please do not send urgent requests through MyChart. Please call the office at [phone]. New and ongoing conditions may require a visit. We have virtual and in-person visits available for your convenience.  Complex MyChart concerns may require a visit. Your provider may request you schedule a virtual or in-person visit to ensure we are providing the best care possible. MyChart messages sent after 11:00 AM on Friday will not be received by the provider until Monday morning.    Lab and Test Results: You will receive your lab and  test results on MyChart as soon as they are completed and results have been sent by the lab or testing facility. Due to this service, you will receive your results BEFORE your provider.  I review lab and test results each morning prior to seeing patients. Some results require collaboration with other providers to ensure you are receiving the most appropriate care. For this reason, we ask that you please allow a minimum of 3-5 business days from the time that ALL results have been received for your provider to receive and review lab and test results and contact you about these.  Most lab and test result comments from the provider will be sent through MyChart. Your provider may recommend changes to the plan of care, follow-up visits, repeat testing, ask questions, or request an office visit to discuss these results. You may reply directly to this message or call the office to provide information for the provider or set up an appointment. In some instances, you will be called with test results and recommendations. Please let us know if this is preferred and we will make note of this in your chart to provide this for you.    If you have not heard a response to your lab or test results in 5 business days from all results returning to MyChart, please call the office to let us know. We ask that you please avoid calling prior to this time unless there is an emergent concern. Due to high call volumes, this can delay the resulting process.  After Hours: For all non-emergency after hours needs,   please call the office at [phone] and select the option to reach the on-call  service. On-call services are shared between multiple [facility] offices and therefore it will not be possible to speak directly with your provider. On-call providers may provide medical advice and recommendations, but are unable to provide refills for maintenance medications.  For all emergency or urgent medical needs after normal business  hours, we recommend that you seek care at the closest Urgent Care or Emergency Department to ensure appropriate treatment in a timely manner.  [facility] has a 24 hour emergency room located on the ground floor for your convenience.   Urgent Concerns During the Business Day Providers are seeing patients from 8AM to 5PM with a busy schedule and are most often not able to respond to non-urgent calls until the end of the day or the next business day. If you should have URGENT concerns during the day, please call and speak to the nurse or schedule a same day appointment so that we can address your concern without delay.   Thank you, again, for choosing me as your health care partner. I appreciate your trust and look forward to learning more about you.   Rena Hunke B. Kaveh Kissinger, DNP, FNP-C  ___________________________________________________________  Health Maintenance Recommendations Screening Testing Mammogram Every 1-2 years based on history and risk factors Starting at age 50 Pap Smear Ages 21-39 every 3 years Ages 30-65 every 5 years with HPV testing More frequent testing may be required based on results and history Colon Cancer Screening Every 1-10 years based on test performed, risk factors, and history Starting at age 45 Bone Density Screening Every 2-10 years based on history Starting at age 65 for women Recommendations for men differ based on medication usage, history, and risk factors AAA Screening One time ultrasound Men 65-75 years old who have ever smoked Lung Cancer Screening Low Dose Lung CT every 12 months Age 50-80 years with a 20 pack-year smoking history who still smoke or who have quit within the last 15 years  Screening Labs Routine  Labs: Complete Blood Count (CBC), Complete Metabolic Panel (CMP), Cholesterol (Lipid Panel) Every 6-12 months based on history and medications May be recommended more frequently based on current conditions or  previous results Hemoglobin A1c Lab Every 3-12 months based on history and previous results Starting at age 45 or earlier with diagnosis of diabetes, high cholesterol, BMI >26, and/or risk factors Frequent monitoring for patients with diabetes to ensure blood sugar control Thyroid Panel (TSH w/ T3 & T4) Every 6 months based on history, symptoms, and risk factors May be repeated more often if on medication HIV One time testing for all patients 13 and older May be repeated more frequently for patients with increased risk factors or exposure Hepatitis C One time testing for all patients 18 and older May be repeated more frequently for patients with increased risk factors or exposure Gonorrhea, Chlamydia Every 12 months for all sexually active persons 13-24 years Additional monitoring may be recommended for those who are considered high risk or who have symptoms PSA Men 40-54 years old with risk factors Additional screening may be recommended from age 55-69 based on risk factors, symptoms, and history  Vaccine Recommendations Tetanus Booster All adults every 10 years Flu Vaccine All patients 6 months and older every year COVID Vaccine All patients 12 years and older Initial dosing with booster May recommend additional booster based on age and health history HPV Vaccine 2 doses all patients   age 9-26 Dosing may be considered for patients over 26 Shingles Vaccine (Shingrix) 2 doses all adults 50 years and older Pneumonia (Pneumovax 23) All adults 65 years and older May recommend earlier dosing based on health history Pneumonia (Prevnar 13) All adults 65 years and older Dosed 1 year after Pneumovax 23 Pneumonia (Prevnar 20) All adults 65 years and older (adults 19-64 with certain conditions or risk factors) 1 dose  For those who have no received Prevnar 13 vaccine previously   Additional Screening, Testing, and Vaccinations may be recommended on an individualized basis based on  family history, health history, risk factors, and/or exposure.  __________________________________________________________  Diet Recommendations for All Patients  I recommend that all patients maintain a diet low in saturated fats, carbohydrates, and cholesterol. While this can be challenging at first, it is not impossible and small changes can make big differences.  Things to try: Decreasing the amount of soda, sweet tea, and/or juice to one or less per day and replace with water While water is always the first choice, if you do not like water you may consider adding a water additive without sugar to improve the taste other sugar free drinks Replace potatoes with a brightly colored vegetable at dinner Use healthy oils, such as canola oil or olive oil, instead of butter or hard margarine Limit your bread intake to two pieces or less a day Replace regular pasta with low carb pasta options Bake, broil, or grill foods instead of frying Monitor portion sizes  Eat smaller, more frequent meals throughout the day instead of large meals  An important thing to remember is, if you love foods that are not great for your health, you don't have to give them up completely. Instead, allow these foods to be a reward when you have done well. Allowing yourself to still have special treats every once in a while is a nice way to tell yourself thank you for working hard to keep yourself healthy.   Also remember that every day is a new day. If you have a bad day and "fall off the wagon", you can still climb right back up and keep moving along on your journey!  We have resources available to help you!  Some websites that may be helpful include: www.MyPlate.gov  Www.VeryWellFit.com _____________________________________________________________  Activity Recommendations for All Patients  I recommend that all adults get at least 20 minutes of moderate physical activity that elevates your heart rate at least 5  days out of the week.  Some examples include: Walking or jogging at a pace that allows you to carry on a conversation Cycling (stationary bike or outdoors) Water aerobics Yoga Weight lifting Dancing If physical limitations prevent you from putting stress on your joints, exercise in a pool or seated in a chair are excellent options.  Do determine your MAXIMUM heart rate for activity: 220 - YOUR AGE = MAX Heart Rate   Remember! Do not push yourself too hard.  Start slowly and build up your pace, speed, weight, time in exercise, etc.  Allow your body to rest between exercise and get good sleep. You will need more water than normal when you are exerting yourself. Do not wait until you are thirsty to drink. Drink with a purpose of getting in at least 8, 8 ounce glasses of water a day plus more depending on how much you exercise and sweat.    If you begin to develop dizziness, chest pain, abdominal pain, jaw pain, shortness of breath, headache,   vision changes, lightheadedness, or other concerning symptoms, stop the activity and allow your body to rest. If your symptoms are severe, seek emergency evaluation immediately. If your symptoms are concerning, but not severe, please let us know so that we can recommend further evaluation.     

## 2022-08-16 NOTE — Progress Notes (Signed)
New Patient Office Visit  Subjective    Patient ID: Kurt Ross, male    DOB: [birth year]  Age: 56 y.o. MRN: [id number]  CC:  Chief Complaint  Patient presents with   Establish Care    Blood work if needed. Not fasting  Wants to get to know PCP      HPI Kurt Ross presents to establish care.  He reports he is taking entecavir for hepatitis (started this about 3 months ago, previous medication was too expensive). Reports he was first diagnosed in 2014. He follows with hepatology ([facility]) every 6 months or so. He would like to have kidney/liver function checked today.   Reports he feels good overall. No new complaints/concerns to discuss today.    He is not on any other chronic medications.    Outpatient Encounter Medications as of 08/16/2022  Medication Sig   entecavir (BARACLUDE) 1 MG tablet Take 1 mg by mouth daily.   [DISCONTINUED] entecavir (BARACLUDE) 0.5 MG tablet Take 1 tablet (0.5 mg total) by mouth daily.   [DISCONTINUED] ciprofloxacin (CILOXAN) 0.3 % ophthalmic solution Place 2 drops into both eyes every 6 (six) hours. (Patient not taking: No sig reported)   [DISCONTINUED] ibuprofen (ADVIL) 800 MG tablet Take 1 tablet (800 mg total) by mouth 3 (three) times daily.   [DISCONTINUED] pantoprazole (PROTONIX) 20 MG tablet Take 1 tablet (20 mg total) by mouth daily. (Patient taking differently: Take 80 mg by mouth daily.)   [DISCONTINUED] polyethylene glycol powder (GLYCOLAX/MIRALAX) powder Take 17 g by mouth daily. (Patient not taking: No sig reported)   [DISCONTINUED] VEMLIDY 25 MG TABS Take 1 tablet by mouth daily.   [DISCONTINUED] vitamin B-12 (CYANOCOBALAMIN) 1000 MCG tablet Take 1,000 mcg by mouth daily.   No facility-administered encounter medications on file as of 08/16/2022.    Past Medical History:  Diagnosis Date   GERD (gastroesophageal reflux disease)    Hepatitis B     History reviewed. No pertinent surgical  history.  History reviewed. No pertinent family history.  Social History   Socioeconomic History   Marital status: Married    Spouse name: Not on file   Number of children: Not on file   Years of education: Not on file   Highest education level: Not on file  Occupational History   Not on file  Tobacco Use   Smoking status: Never   Smokeless tobacco: Never  Substance and Sexual Activity   Alcohol use: No   Drug use: No   Sexual activity: Not on file  Other Topics Concern   Not on file  Social History Narrative   Not on file   Social Determinants of Health   Financial Resource Strain: Not on file  Food Insecurity: Not on file  Transportation Needs: Not on file  Physical Activity: Not on file  Stress: Not on file  Social Connections: Not on file  Intimate Partner Violence: Not on file    ROS All review of systems negative except what is listed in the HPI      Objective    BP 138/74   Pulse 65   Temp 97.9 F (36.6 C) (Oral)   Ht 6\' 1"  (1.854 m)   Wt 218 lb 3.2 oz (99 kg)   SpO2 97%   BMI 28.79 kg/m   Physical Exam Vitals reviewed.  Constitutional:      Appearance: Normal appearance.  Cardiovascular:     Rate and Rhythm: Normal rate and regular rhythm.  Pulses: Normal pulses.     Heart sounds: Normal heart sounds.  Pulmonary:     Effort: Pulmonary effort is normal.     Breath sounds: Normal breath sounds.  Skin:    General: Skin is warm and dry.  Neurological:     Mental Status: He is alert and oriented to person, place, and time.  Psychiatric:        Mood and Affect: Mood normal.        Behavior: Behavior normal.        Thought Content: Thought content normal.        Judgment: Judgment normal.         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  Continue following with GI/hepatology CMP updated today      Relevant Medications   entecavir (BARACLUDE) 1 MG tablet   Other Relevant Orders   Comprehensive metabolic panel   Other Visit Diagnoses     Encounter to establish care     Recommend CPE to update other labs        Return in about 3 months (around 11/16/2022) for CPE.   Terrilyn Saver, NP

## 2022-08-17 ENCOUNTER — Other Ambulatory Visit: Payer: Commercial Managed Care - HMO

## 2022-08-18 ENCOUNTER — Other Ambulatory Visit (INDEPENDENT_AMBULATORY_CARE_PROVIDER_SITE_OTHER): Payer: Commercial Managed Care - HMO

## 2022-08-18 DIAGNOSIS — B181 Chronic viral hepatitis B without delta-agent: Secondary | ICD-10-CM

## 2022-08-18 NOTE — Addendum Note (Signed)
Addended by: Thelma Barge D on: 08/18/2022 08:25 AM   Modules accepted: Orders

## 2022-08-19 LAB — COMPREHENSIVE METABOLIC PANEL
AG Ratio: 1.4 (calc) (ref 1.0–2.5)
ALT: 21 U/L (ref 9–46)
AST: 22 U/L (ref 10–35)
Albumin: 4.2 g/dL (ref 3.6–5.1)
Alkaline phosphatase (APISO): 62 U/L (ref 35–144)
BUN: 10 mg/dL (ref 7–25)
CO2: 27 mmol/L (ref 20–32)
Calcium: 8.8 mg/dL (ref 8.6–10.3)
Chloride: 102 mmol/L (ref 98–110)
Creat: 0.82 mg/dL (ref 0.70–1.30)
Globulin: 3 g/dL (calc) (ref 1.9–3.7)
Glucose, Bld: 96 mg/dL (ref 65–99)
Potassium: 3.8 mmol/L (ref 3.5–5.3)
Sodium: 139 mmol/L (ref 135–146)
Total Bilirubin: 0.4 mg/dL (ref 0.2–1.2)
Total Protein: 7.2 g/dL (ref 6.1–8.1)

## 2022-11-16 ENCOUNTER — Encounter: Payer: Self-pay | Admitting: Family Medicine

## 2022-11-16 ENCOUNTER — Ambulatory Visit (INDEPENDENT_AMBULATORY_CARE_PROVIDER_SITE_OTHER): Payer: Commercial Managed Care - HMO | Admitting: Family Medicine

## 2022-11-16 VITALS — BP 129/69 | HR 87 | Temp 97.6°F | Resp 16 | Ht 73.0 in | Wt 217.6 lb

## 2022-11-16 DIAGNOSIS — H6123 Impacted cerumen, bilateral: Secondary | ICD-10-CM | POA: Diagnosis not present

## 2022-11-16 DIAGNOSIS — Z0001 Encounter for general adult medical examination with abnormal findings: Secondary | ICD-10-CM

## 2022-11-16 DIAGNOSIS — Z125 Encounter for screening for malignant neoplasm of prostate: Secondary | ICD-10-CM | POA: Diagnosis not present

## 2022-11-16 DIAGNOSIS — Z13220 Encounter for screening for lipoid disorders: Secondary | ICD-10-CM

## 2022-11-16 DIAGNOSIS — Z136 Encounter for screening for cardiovascular disorders: Secondary | ICD-10-CM

## 2022-11-16 LAB — LIPID PANEL
Cholesterol: 144 mg/dL (ref 0–200)
HDL: 36.9 mg/dL — ABNORMAL LOW (ref >39.00)
LDL Cholesterol: 90 mg/dL (ref 0–99)
NonHDL: 107.27
Total CHOL/HDL Ratio: 4
Triglycerides: 86 mg/dL (ref 0.0–149.0)
VLDL: 17.2 mg/dL (ref 0.0–40.0)

## 2022-11-16 LAB — PSA: PSA: 0.39 ng/mL (ref 0.10–4.00)

## 2022-11-16 NOTE — Progress Notes (Signed)
Complete physical exam  Patient: Kurt Ross   DOB: 01-Jul-1966   57 y.o. Male  MRN: [id number]  Subjective:    Chief Complaint  Patient presents with   Annual Exam    Kurt Ross is a 57 y.o. male who presents today for a complete physical exam. He reports consuming a general diet. Gym/ health club routine includes 2-3x week of cardio and resistance training. He generally feels well. He reports sleeping well. He does not have additional problems to discuss today.   Chronic conditions: - chronic viral hepatitis B and cirrhosis: following with GI ([facility]). Currently taking entecavir.   Leukopenia: - He has been following with the cancer center. Thought to be related to viral hepatitis or possibly antiviral. They recommended continue surveillance given that he has been asymptomatic. He is planning to schedule his next follow-up with them. Last WBC in January 2024 was 2.8 (he has been staying around 2.7-3.3 over the past few years). No new symptoms.  He had a full blood panel in January 2024   Most recent fall risk assessment:    11/16/2022   10:27 AM  [place] in the past year? 0  Number falls in past yr: 0  Injury with Fall? 0  Risk for fall due to : No Fall Risks  Follow up Falls evaluation completed     Most recent depression screenings:    11/16/2022   10:27 AM 08/16/2022    1:17 PM  PHQ 2/9 Scores  PHQ - 2 Score 0 0    Vision:Not within last year , Dental: No current dental problems and No regular dental care , and PSA: Prostate cancer screening and PSA options (with potential risks and benefits of testing vs. not testing) were discussed along with recent recs/guidelines.  He would like testing today.  Patient Active Problem List   Diagnosis Date Noted   Leukopenia 03/18/2021   Chronic viral hepatitis B without delta-agent ([place]) 01/08/2020   GERD (gastroesophageal reflux disease) 07/17/2013   Conjunctivitis, acute, right eye 04/05/2013   Other abnormal  blood chemistry 03/23/2013   Past Medical History:  Diagnosis Date   GERD (gastroesophageal reflux disease)    Hepatitis B    History reviewed. No pertinent family history. Allergies  Allergen Reactions   Codeine Nausea And Vomiting   Nsaids    Penicillins Rash      Patient Care Team: Terrilyn Saver, NP as PCP - General (Family Medicine)   Outpatient Medications Prior to Visit  Medication Sig   entecavir (BARACLUDE) 1 MG tablet Take 1 mg by mouth daily.   No facility-administered medications prior to visit.    ROS All review of systems negative except what is listed in the HPI        Objective:     BP 129/69   Pulse 87   Temp 97.6 F (36.4 C)   Resp 16   Ht 6' 1"$  (1.854 m)   Wt 217 lb 9.6 oz (98.7 kg)   SpO2 97%   BMI 28.71 kg/m    Physical Exam Vitals reviewed.  Constitutional:      General: He is not in acute distress.    Appearance: Normal appearance. He is not ill-appearing.  HENT:     Head: Normocephalic and atraumatic.     Right Ear: There is impacted cerumen.     Left Ear: There is impacted cerumen.     Nose: Nose normal.     Mouth/Throat:  Mouth: Mucous membranes are moist.     Pharynx: Oropharynx is clear.  Eyes:     Extraocular Movements: Extraocular movements intact.     Conjunctiva/sclera: Conjunctivae normal.     Pupils: Pupils are equal, round, and reactive to light.  Neck:     Vascular: No carotid bruit.  Cardiovascular:     Rate and Rhythm: Normal rate and regular rhythm.     Pulses: Normal pulses.     Heart sounds: Normal heart sounds.  Pulmonary:     Effort: Pulmonary effort is normal.     Breath sounds: Normal breath sounds.  Abdominal:     General: Abdomen is flat. Bowel sounds are normal. There is no distension.     Palpations: Abdomen is soft. There is no mass.     Tenderness: There is no abdominal tenderness. There is no right CVA tenderness, left CVA tenderness, guarding or rebound.  Genitourinary:    Comments:  Deferred exam Musculoskeletal:        General: Normal range of motion.     Cervical back: Normal range of motion and neck supple. No tenderness.     Right lower leg: No edema.     Left lower leg: No edema.  Lymphadenopathy:     Cervical: No cervical adenopathy.  Skin:    General: Skin is warm and dry.     Capillary Refill: Capillary refill takes less than 2 seconds.  Neurological:     General: No focal deficit present.     Mental Status: He is alert and oriented to person, place, and time. Mental status is at baseline.  Psychiatric:        Mood and Affect: Mood normal.        Behavior: Behavior normal.        Thought Content: Thought content normal.        Judgment: Judgment normal.      No results found for any visits on 11/16/22.     Assessment & Plan:    Routine Health Maintenance and Physical Exam  Immunization History  Administered Date(s) Administered   Hepatitis A, Adult 04/24/2014, 12/24/2014   Influenza-Unspecified 07/23/2022    Health Maintenance  Topic Date Due   COVID-19 Vaccine (1) Never done   DTaP/Tdap/Td (1 - Tdap) Never done   Zoster Vaccines- Shingrix (1 of 2) Never done   COLONOSCOPY (Pts 45-28yr Insurance coverage will need to be confirmed)  01/01/2030   INFLUENZA VACCINE  Completed   Hepatitis C Screening  Completed   HIV Screening  Completed   HPV VACCINES  Aged Out    Discussed health benefits of physical activity, and encouraged him to engage in regular exercise appropriate for his age and condition.  Problem List Items Addressed This Visit   None Visit Diagnoses     Screening for prostate cancer    -  Primary   Relevant Orders   PSA   Encounter for lipid screening for cardiovascular disease       Relevant Orders   Lipid panel   Encounter for routine adult health examination with abnormal findings       Bilateral impacted cerumen     Indication: Cerumen impaction of the ear(s)  Medical necessity statement: On physical  examination, cerumen impairs clinically significant portions of the external auditory canal, and tympanic membrane. Noted obstructive, copious cerumen that cannot be removed without magnification and instrumentations requiring professional removal.   Consent: Discussed benefits and risks of procedure and verbal consent obtained  Procedure: Patient was prepped for the procedure. Otoscope utilized to assess and take note of the ear canal, the tympanic membrane, and the presence, amount, and placement of the cerumen.  Liquid docusate sodium instilled into the ear canal(s) and allowed to penetrate for 5 minutes before drainage by gravity. Gentle irrigation with water at body temperature and soft plastic curette utilized to remove impacted cerumen.  Excess water drained by gravity and ear canal(s) dried with clean guaze.  Post procedure examination: Otoscopic examination reveals complete cerumen removal with no damage to the auditory canal, tympanic membrane, or surrounding tissue.  Patient tolerated procedure well.   Post procedure instructions: Patient made aware that they may experience temporary vertigo, temporary changes in hearing, and temporary discomfort. If these symptom last for more than 24 hours to call the clinic or proceed to the ED for further evaluation. Discussed avoiding placing objects into the ear canal for cleaning.         Return in about 1 year (around 11/17/2023) for physical.     Terrilyn Saver, NP

## 2022-11-16 NOTE — Patient Instructions (Signed)
Good to see you today! Most of your labs were just done last month, so you opted to skip repeating these. We will just check your cholesterol and prostate level today.  Schedule your routing follow-up with hematology for continued monitoring of low white blood cell counts.  Let us know if you develop any new symptoms.

## 2022-12-01 ENCOUNTER — Inpatient Hospital Stay: Payer: Commercial Managed Care - HMO

## 2022-12-01 ENCOUNTER — Inpatient Hospital Stay: Payer: Commercial Managed Care - HMO | Attending: Hematology and Oncology | Admitting: Hematology and Oncology

## 2022-12-01 ENCOUNTER — Encounter: Payer: Self-pay | Admitting: Hematology and Oncology

## 2022-12-01 ENCOUNTER — Other Ambulatory Visit: Payer: Self-pay

## 2022-12-01 VITALS — BP 138/69 | HR 65 | Temp 97.5°F | Resp 18 | Ht 73.0 in | Wt 215.6 lb

## 2022-12-01 DIAGNOSIS — B1910 Unspecified viral hepatitis B without hepatic coma: Secondary | ICD-10-CM | POA: Insufficient documentation

## 2022-12-01 DIAGNOSIS — Z79899 Other long term (current) drug therapy: Secondary | ICD-10-CM | POA: Diagnosis not present

## 2022-12-01 DIAGNOSIS — D709 Neutropenia, unspecified: Secondary | ICD-10-CM

## 2022-12-01 DIAGNOSIS — R5383 Other fatigue: Secondary | ICD-10-CM | POA: Insufficient documentation

## 2022-12-01 DIAGNOSIS — D72819 Decreased white blood cell count, unspecified: Secondary | ICD-10-CM | POA: Insufficient documentation

## 2022-12-01 DIAGNOSIS — Z880 Allergy status to penicillin: Secondary | ICD-10-CM | POA: Diagnosis not present

## 2022-12-01 DIAGNOSIS — Z885 Allergy status to narcotic agent status: Secondary | ICD-10-CM | POA: Insufficient documentation

## 2022-12-01 DIAGNOSIS — K219 Gastro-esophageal reflux disease without esophagitis: Secondary | ICD-10-CM | POA: Insufficient documentation

## 2022-12-01 DIAGNOSIS — Z886 Allergy status to analgesic agent status: Secondary | ICD-10-CM | POA: Insufficient documentation

## 2022-12-01 LAB — CBC WITH DIFFERENTIAL/PLATELET
Abs Immature Granulocytes: 0 10*3/uL (ref 0.00–0.07)
Basophils Absolute: 0.1 10*3/uL (ref 0.0–0.1)
Basophils Relative: 1 %
Eosinophils Absolute: 0.1 10*3/uL (ref 0.0–0.5)
Eosinophils Relative: 2 %
HCT: 44.1 % (ref 39.0–52.0)
Hemoglobin: 15.7 g/dL (ref 13.0–17.0)
Immature Granulocytes: 0 %
Lymphocytes Relative: 33 %
Lymphs Abs: 1.3 10*3/uL (ref 0.7–4.0)
MCH: 30 pg (ref 26.0–34.0)
MCHC: 35.6 g/dL (ref 30.0–36.0)
MCV: 84.2 fL (ref 80.0–100.0)
Monocytes Absolute: 0.6 10*3/uL (ref 0.1–1.0)
Monocytes Relative: 15 %
Neutro Abs: 1.9 10*3/uL (ref 1.7–7.7)
Neutrophils Relative %: 49 %
Platelets: 211 10*3/uL (ref 150–400)
RBC: 5.24 MIL/uL (ref 4.22–5.81)
RDW: 12.9 % (ref 11.5–15.5)
WBC: 3.9 10*3/uL — ABNORMAL LOW (ref 4.0–10.5)
nRBC: 0 % (ref 0.0–0.2)

## 2022-12-01 LAB — VITAMIN B12: Vitamin B-12: 252 pg/mL (ref 180–914)

## 2022-12-01 NOTE — Progress Notes (Signed)
[facility] CONSULT NOTE  Patient Care Team: Terrilyn Saver, NP as PCP - General (Family Medicine)  CHIEF COMPLAINTS/PURPOSE OF CONSULTATION:  Leukopenia.  ASSESSMENT & PLAN:   No orders of the defined types were placed in this encounter.  This is a very pleasant 57 year old male patient who was referred to hematology for evaluation of neutropenia which has been chronic and longstanding with some worsening in the past year.   He tells me that since his last visit here, he had some insurance labs and he could not come back for follow-up.  No concerns on review of systems or physical examination.  He continues on Entecavir for hepatitis B.  We will proceed with CBC and B12 levels today.  If there is stable leukopenia, he can return to clinic once a year.  He is agreeable to these findings.  He also mentions that he has fatigue when he takes vitamin B12 hence we added vitamin B12 levels to see if he actually will need the supplementation in the long run.  Thank you for consulting [place] in the care of this patient.  Please do not hesitate to contact us with any additional questions or concerns.  HISTORY OF PRESENTING ILLNESS:  Kurt Ross 57 y.o. male is here because of Leukopenia.  This is a very pleasant 57 year old male patient with past medical history significant for hepatitis B and GERD followed by gastroenterology on tenofovir referred to hematology for evaluation of worsening leukopenia.    Interval History  This is a very pleasant 57 year old male with hepatitis B on tenofovir who is here for follow-up regarding his leukopenia.  He was last seen in September 2022 and was lost to follow-up. He was lost to follow up since his insurance didn't cover hematology issues. He is feeling well. NO B symptoms. He is still on entacavir for Hep B.  MEDICAL HISTORY:  Past Medical History:  Diagnosis Date   GERD (gastroesophageal reflux disease)    Hepatitis B     SURGICAL  HISTORY: No past surgical history on file.  SOCIAL HISTORY: Social History   Socioeconomic History   Marital status: Married    Spouse name: Not on file   Number of children: Not on file   Years of education: Not on file   Highest education level: Not on file  Occupational History   Not on file  Tobacco Use   Smoking status: Never   Smokeless tobacco: Never  Substance and Sexual Activity   Alcohol use: No   Drug use: No   Sexual activity: Not on file  Other Topics Concern   Not on file  Social History Narrative   Not on file   Social Determinants of Health   Financial Resource Strain: Not on file  Food Insecurity: Not on file  Transportation Needs: Not on file  Physical Activity: Not on file  Stress: Not on file  Social Connections: Not on file  Intimate Partner Violence: Not on file    FAMILY HISTORY: No family history on file.  ALLERGIES:  is allergic to codeine, nsaids, and penicillins.  MEDICATIONS:  Current Outpatient Medications  Medication Sig Dispense Refill   entecavir (BARACLUDE) 1 MG tablet Take 1 mg by mouth daily.     No current facility-administered medications for this visit.     PHYSICAL EXAMINATION:  ECOG PERFORMANCE STATUS: 0 - Asymptomatic Physical Exam Constitutional:      Appearance: Normal appearance.  HENT:     Head: Normocephalic and  atraumatic.  Cardiovascular:     Rate and Rhythm: Normal rate and regular rhythm.     Pulses: Normal pulses.     Heart sounds: Normal heart sounds.  Pulmonary:     Effort: Pulmonary effort is normal.     Breath sounds: Normal breath sounds.  Abdominal:     General: Abdomen is flat.     Palpations: Abdomen is soft.  Musculoskeletal:        General: No swelling or tenderness.     Cervical back: Normal range of motion and neck supple. No rigidity.  Lymphadenopathy:     Cervical: No cervical adenopathy.  Skin:    General: Skin is warm and dry.  Neurological:     General: No focal deficit  present.     Mental Status: He is alert.  Psychiatric:        Mood and Affect: Mood normal.      LABORATORY DATA:  I have reviewed the data as listed Lab Results  Component Value Date   WBC 6.1 06/08/2022   HGB 15.1 06/08/2022   HCT 43.8 06/08/2022   MCV 86.7 06/08/2022   PLT 169 06/08/2022     Chemistry      Component Value Date/Time   NA 139 08/18/2022 1354   K 3.8 08/18/2022 1354   CL 102 08/18/2022 1354   CO2 27 08/18/2022 1354   BUN 10 08/18/2022 1354   CREATININE 0.82 08/18/2022 1354      Component Value Date/Time   CALCIUM 8.8 08/18/2022 1354   ALKPHOS 76 03/02/2021 1208   AST 22 08/18/2022 1354   AST 29 03/02/2021 1208   ALT 21 08/18/2022 1354   ALT 30 03/02/2021 1208   BILITOT 0.4 08/18/2022 1354   BILITOT 0.5 03/02/2021 1208     I have reviewed his labs.  RADIOGRAPHIC STUDIES: I have personally reviewed the radiological images as listed and agreed with the findings in the report. No results found.  All questions were answered. The patient knows to call the clinic with any problems, questions or concerns. I spent 20 minutes in the care of this patient including history, review of records, counseling and coordination of care.    Benay Pike, MD 12/01/2022 3:07 PM

## 2022-12-02 ENCOUNTER — Telehealth: Payer: Self-pay | Admitting: *Deleted

## 2022-12-02 NOTE — Telephone Encounter (Signed)
-----   Message from Benay Pike, MD sent at 12/01/2022  5:21 PM EST ----- WBC is better, no major concerns, B!2 remains low normal, so better to continue B12 supplementation.

## 2022-12-02 NOTE — Telephone Encounter (Signed)
This RN spoke with pt per MD review of lab and recommendation to continue B12 supplementation.  Pt states he stopped taking the B12 because it was make me so tired and hungry.  This RN reviewed above including unusual side effects related to B12 and asked pt to resume and monitor how he is feeling and call this RN with update.  Pt verbalized agreement and understanding.

## 2022-12-20 IMAGING — CR DG CHEST 2V
2 series · 2 of 2 positions shown · non-contrast
Comparison: None.

CLINICAL DATA: Cough.  Possible blood tinged sputum.

EXAM:
CHEST - 2 VIEW

[w chest pa]
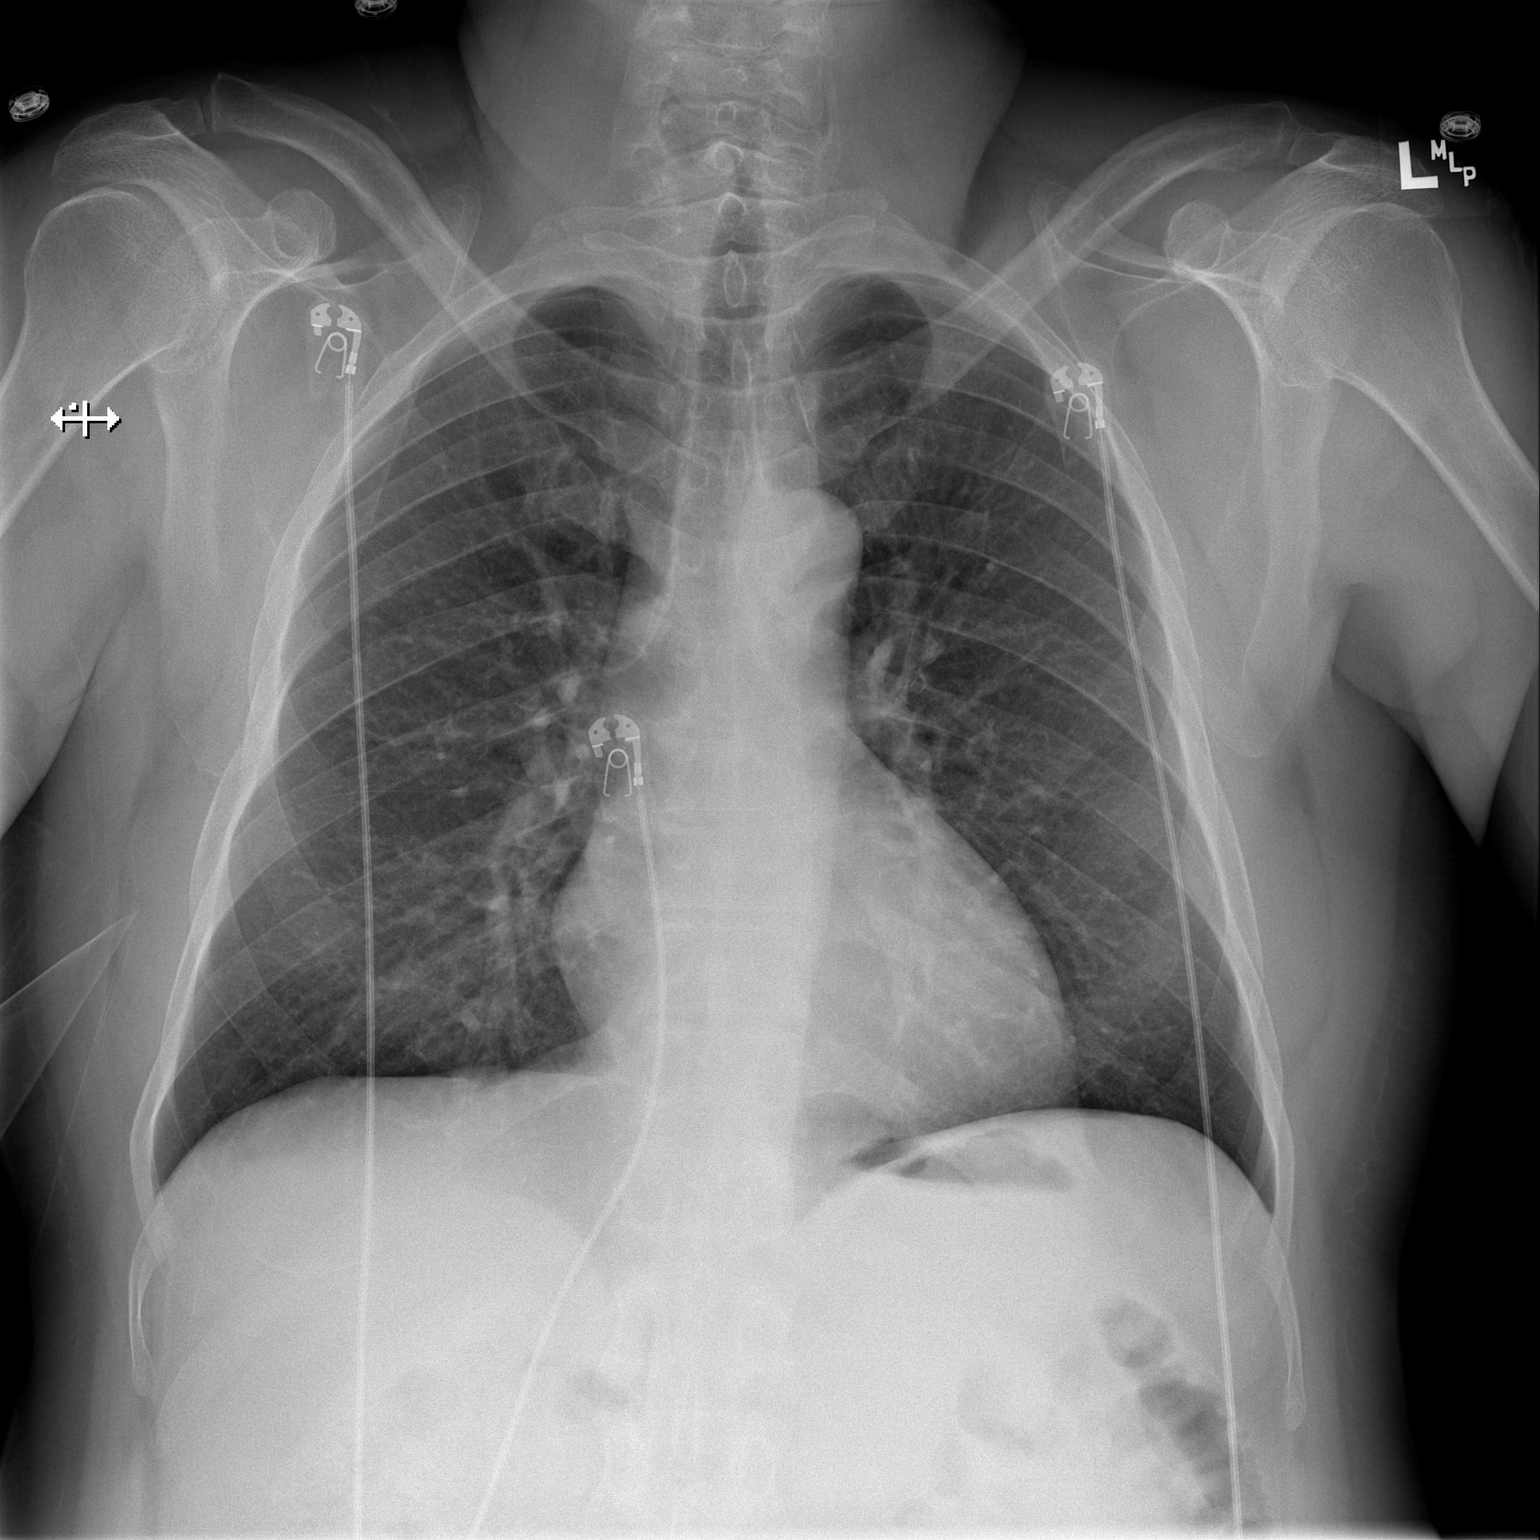

[w chest lat]
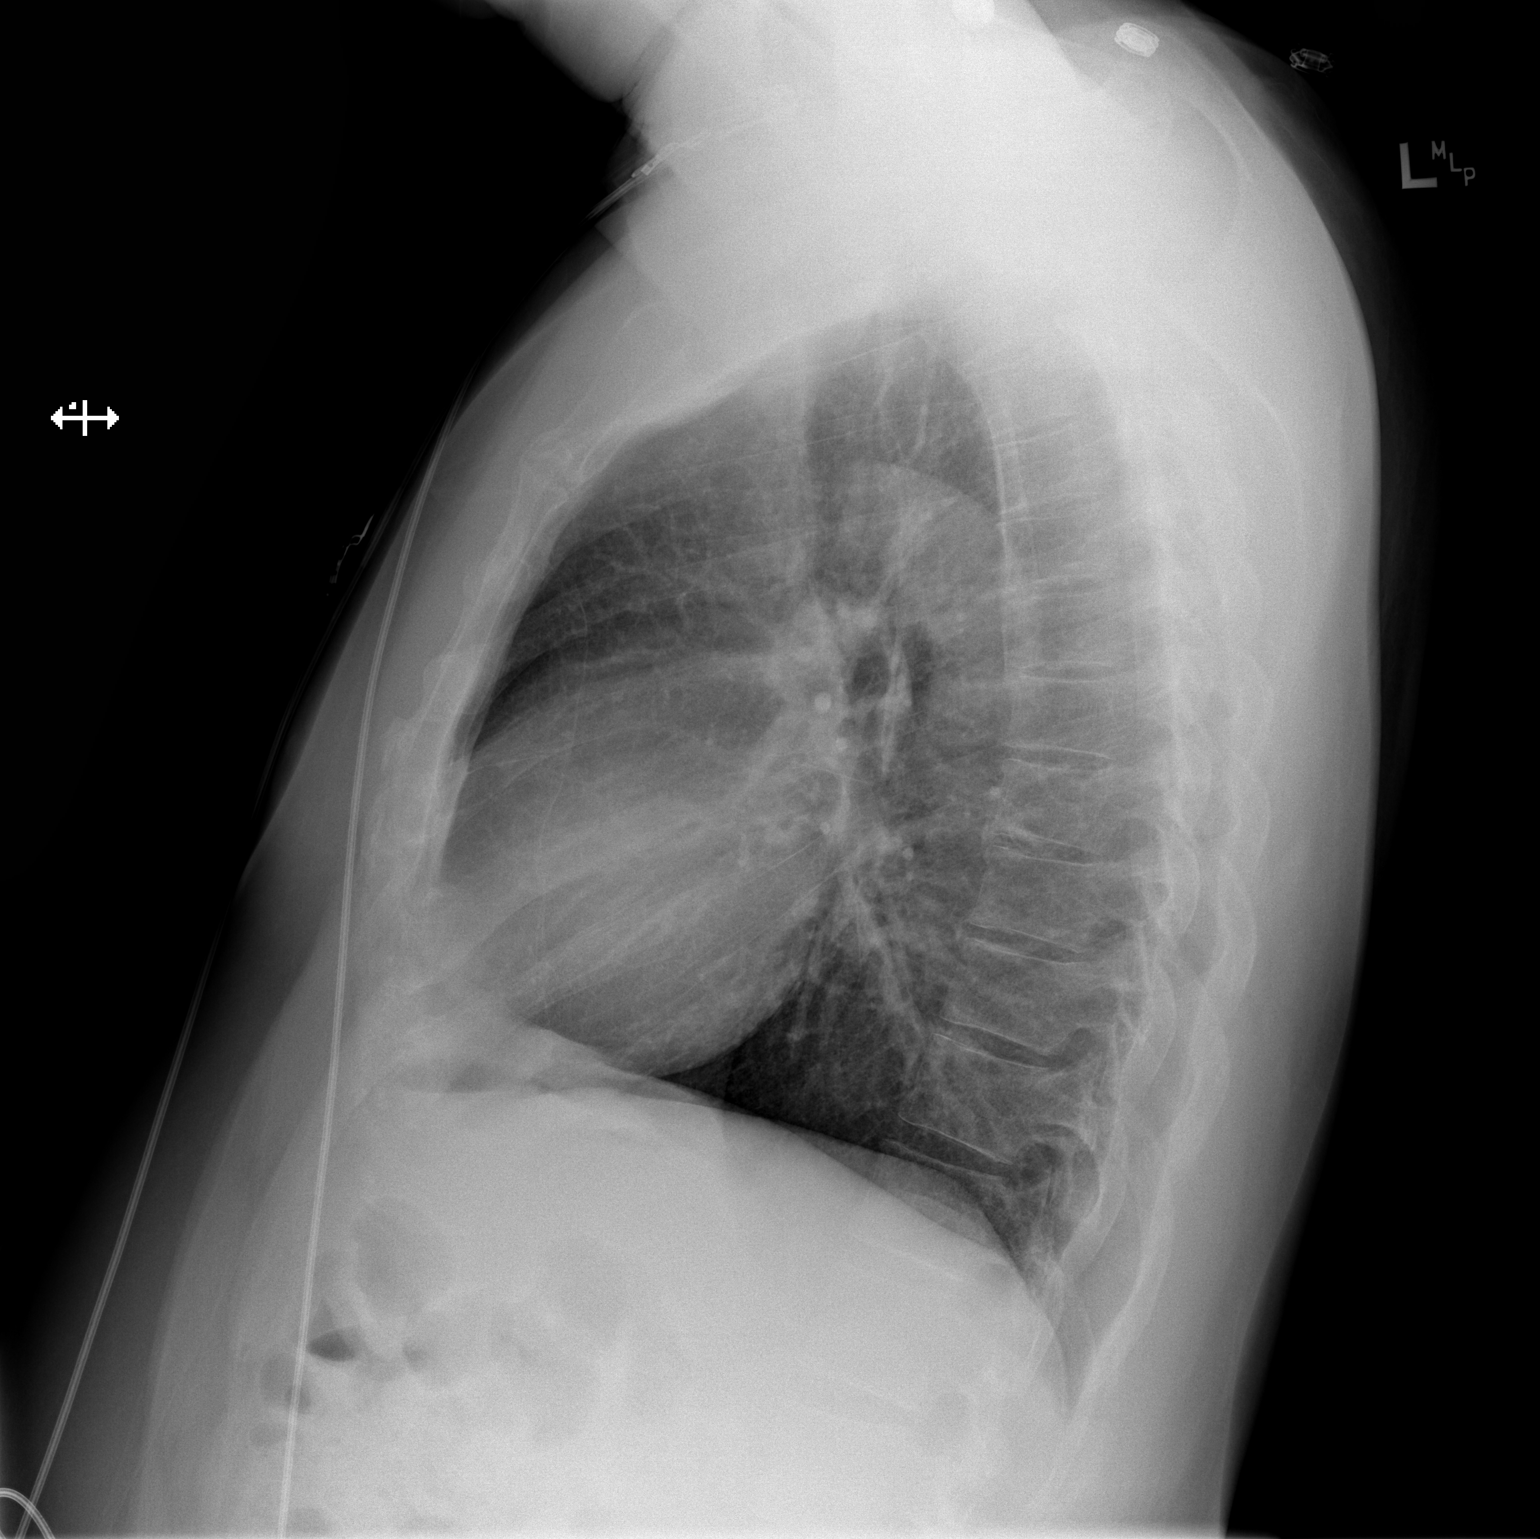

[2 of 2 positions shown; findings below may reference images not displayed]

FINDINGS: The cardiomediastinal contours are normal. The lungs are clear.
Pulmonary vasculature is normal. No consolidation, pleural effusion,
or pneumothorax. No acute osseous abnormalities are seen.
IMPRESSION: No acute chest findings.

## 2023-01-10 ENCOUNTER — Encounter: Payer: Self-pay | Admitting: *Deleted

## 2023-07-08 ENCOUNTER — Ambulatory Visit: Payer: Commercial Managed Care - HMO | Admitting: Family Medicine

## 2023-07-13 ENCOUNTER — Ambulatory Visit: Payer: Commercial Managed Care - HMO | Admitting: Family Medicine

## 2023-07-20 ENCOUNTER — Ambulatory Visit: Payer: Commercial Managed Care - HMO | Admitting: Family Medicine

## 2023-08-01 ENCOUNTER — Encounter: Payer: Self-pay | Admitting: Family Medicine

## 2023-08-01 ENCOUNTER — Ambulatory Visit (INDEPENDENT_AMBULATORY_CARE_PROVIDER_SITE_OTHER): Payer: Managed Care, Other (non HMO) | Admitting: Family Medicine

## 2023-08-01 VITALS — BP 136/77 | HR 68 | Ht 73.0 in | Wt 219.0 lb

## 2023-08-01 DIAGNOSIS — H6121 Impacted cerumen, right ear: Secondary | ICD-10-CM

## 2023-08-01 DIAGNOSIS — H6593 Unspecified nonsuppurative otitis media, bilateral: Secondary | ICD-10-CM

## 2023-08-01 DIAGNOSIS — F5101 Primary insomnia: Secondary | ICD-10-CM | POA: Diagnosis not present

## 2023-08-01 DIAGNOSIS — F439 Reaction to severe stress, unspecified: Secondary | ICD-10-CM | POA: Diagnosis not present

## 2023-08-01 MED ORDER — HYDROXYZINE HCL 10 MG PO TABS: 10.0000 mg | ORAL_TABLET | Freq: Every evening | ORAL | 2 refills | Status: DC | PRN

## 2023-08-01 MED ORDER — TRIAMCINOLONE ACETONIDE 55 MCG/ACT NA AERO: 2.0000 | INHALATION_SPRAY | Freq: Every day | NASAL | 12 refills | Status: DC

## 2023-08-01 NOTE — Progress Notes (Signed)
Acute Office Visit  Subjective:     Patient ID: Kurt Ross, male    DOB: 05-04-1966, 57 y.o.   MRN: [id number]  Chief Complaint  Patient presents with   Ear Problem    HPI Patient is in today for ear fullness and muffled hearing.  Discussed the use of AI scribe software for clinical note transcription with the patient, who gave verbal consent to proceed.  History of Present Illness   The patient, with an unspecified medical history, presents with bilateral ear discomfort that began three days ago. They report a sensation of fullness and hearing abnormalities, described as a 'double voice' or echo, but deny any associated pain. Symptoms present particularly in the mornings, with improvement later in the day. The patient denies any concurrent symptoms of infection such as fever, cough, or sore throat.  The patient also reports difficulty sleeping, with an average of three to four hours of sleep per night. They describe this as a stress-related issue, with a racing mind preventing sleep despite a consistent bedtime routine. The patient denies any symptoms of anxiety or nervousness associated with this insomnia.            ROS All review of systems negative except what is listed in the HPI      Objective:    BP 136/77   Pulse 68   Ht 6\' 1"  (1.854 m)   Wt 219 lb (99.3 kg)   SpO2 99%   BMI 28.89 kg/m    Physical Exam Vitals reviewed.  Constitutional:      Appearance: Normal appearance.  HENT:     Right Ear: A middle ear effusion is present. There is impacted cerumen.     Left Ear: A middle ear effusion is present.     Mouth/Throat:     Pharynx: No oropharyngeal exudate or posterior oropharyngeal erythema.  Cardiovascular:     Rate and Rhythm: Normal rate and regular rhythm.     Pulses: Normal pulses.     Heart sounds: Normal heart sounds.  Pulmonary:     Effort: Pulmonary effort is normal.     Breath sounds: Normal breath sounds.  Neurological:     Mental  Status: He is alert and oriented to person, place, and time.  Psychiatric:        Mood and Affect: Mood normal.        Behavior: Behavior normal.        Thought Content: Thought content normal.        Judgment: Judgment normal.         No results found for any visits on 08/01/23.      Assessment & Plan:   Problem List Items Addressed This Visit       Active Problems   Primary insomnia   Relevant Medications   hydrOXYzine (ATARAX) 10 MG tablet   Other Visit Diagnoses     Fluid level behind tympanic membrane of both ears    -  Primary   Relevant Medications   triamcinolone (NASACORT) 55 MCG/ACT AERO nasal inhaler   Impacted cerumen of right ear       Stress             Eustachian Tube Dysfunction Morning symptoms of fullness and muffled hearing in the left ear. No signs of infection or obstruction. Possible fluid accumulation. -Start Nasacort daily for at least 1-2 months. -Return if hearing loss becomes persistent or other symptoms develop.  Insomnia Difficulty sleeping, possibly related to  stress. No signs of anxiety or panic. No current sleep aids in use. -Start Hydroxyzine 30 minutes before bedtime. -Implement sleep hygiene practices, including a consistent bedtime routine and regular sleep/wake times. -Follow up to assess effectiveness.      Indication: Cerumen impaction of the ear(s)  Medical necessity statement: On physical examination, cerumen impairs clinically significant portions of the external auditory canal, and tympanic membrane. Noted obstructive, copious cerumen that cannot be removed without magnification and instrumentations requiring professional removal.   Consent: Discussed benefits and risks of procedure and verbal consent obtained  Procedure: Patient was prepped for the procedure. Otoscope utilized to assess and take note of the ear canal, the tympanic membrane, and the presence, amount, and placement of the cerumen.  Gentle irrigation  with water at body temperature to remove impacted cerumen.  Excess water drained by gravity and ear canal(s) dried with clean guaze.  Post procedure examination: Otoscopic examination reveals complete cerumen removal with no damage to the auditory canal, tympanic membrane, or surrounding tissue.  Patient tolerated procedure well.   Post procedure instructions: Patient made aware that they may experience temporary vertigo, temporary changes in hearing, and temporary discomfort. If these symptom last for more than 24 hours to call the clinic or proceed to the ED for further evaluation. Discussed avoiding placing objects into the ear canal for cleaning.  Meds ordered this encounter  Medications   triamcinolone (NASACORT) 55 MCG/ACT AERO nasal inhaler    Sig: Place 2 sprays into the nose daily.    Dispense:  1 each    Refill:  12    Order Specific Question:   Supervising Provider    Answer:   Danise Edge A [[id number]]   hydrOXYzine (ATARAX) 10 MG tablet    Sig: Take 1-2 tablets (10-20 mg total) by mouth at bedtime as needed for anxiety (insomnia).    Dispense:  30 tablet    Refill:  2    Order Specific Question:   Supervising Provider    Answer:   Danise Edge A [[id number]]     Return if symptoms worsen or fail to improve.  Clayborne Dana, NP

## 2023-08-04 ENCOUNTER — Encounter: Payer: Self-pay | Admitting: Family Medicine

## 2023-08-04 ENCOUNTER — Ambulatory Visit: Payer: Managed Care, Other (non HMO) | Admitting: Family Medicine

## 2023-08-04 VITALS — BP 137/84 | HR 80 | Ht 73.0 in | Wt 219.0 lb

## 2023-08-04 DIAGNOSIS — H9192 Unspecified hearing loss, left ear: Secondary | ICD-10-CM

## 2023-08-04 DIAGNOSIS — H6592 Unspecified nonsuppurative otitis media, left ear: Secondary | ICD-10-CM | POA: Diagnosis not present

## 2023-08-04 MED ORDER — PREDNISONE 20 MG PO TABS: 40.0000 mg | ORAL_TABLET | Freq: Every day | ORAL | 0 refills | Status: AC

## 2023-08-04 NOTE — Progress Notes (Signed)
Acute Office Visit  Subjective:     Patient ID: Kurt Ross, male    DOB: [birth year], 57 y.o.   MRN: [id number]  Chief Complaint  Patient presents with   Hearing Loss    HPI Patient is in today for ear problem.   Discussed the use of AI scribe software for clinical note transcription with the patient, who gave verbal consent to proceed.  History of Present Illness   The patient presents with a chief complaint of persistent left-sided ear discomfort, described as a sensation of heaviness and pressure, likened to being in a tunnel. The patient reports no associated pain, but notes a significant reduction in hearing, particularly noticeable upon waking in the morning. The auditory disturbance is described as muffled, with an echo-like quality, as if there is water in the ear.  The patient has been using prescribed nasal sprays, but reports no improvement in symptoms. There is no associated dizziness or vertigo. The patient has attempted to alleviate symptoms using an over-the-counter earwax removal product purchased from a local pharmacy, but without success.  The patient also mentions a previous habit of using Q-tips for ear cleaning, but has since discontinued this practice.           ROS All review of systems negative except what is listed in the HPI      Objective:    BP 137/84   Pulse 80   Ht 6\' 1"  (1.854 m)   Wt 219 lb (99.3 kg)   SpO2 99%   BMI 28.89 kg/m    Physical Exam Vitals reviewed.  Constitutional:      Appearance: Normal appearance.  HENT:     Right Ear: Tympanic membrane, ear canal and external ear normal. There is no impacted cerumen.     Left Ear: Ear canal and external ear normal. A middle ear effusion is present. There is no impacted cerumen.  Neurological:     Mental Status: He is alert and oriented to person, place, and time.  Psychiatric:        Mood and Affect: Mood normal.        Behavior: Behavior normal.        Thought Content:  Thought content normal.        Judgment: Judgment normal.     No results found for any visits on 08/04/23.      Assessment & Plan:   Problem List Items Addressed This Visit   None Visit Diagnoses     Decreased hearing of left ear    -  Primary   Relevant Medications   predniSONE (DELTASONE) 20 MG tablet   Other Relevant Orders   Ambulatory referral to ENT   Fluid level behind tympanic membrane of left ear       Relevant Medications   predniSONE (DELTASONE) 20 MG tablet   Other Relevant Orders   Ambulatory referral to ENT      Persistent sensation of pressure and muffled hearing, likely due to fluid accumulation. No associated pain or dizziness. No improvement with nasal sprays. -Start Prednisone, with instructions on potential side effects and usage. -Continue nasal sprays. -Referral to ENT for further evaluation if no improvement. -Advise against use of Q-tips for ear cleaning.        Meds ordered this encounter  Medications   predniSONE (DELTASONE) 20 MG tablet    Sig: Take 2 tablets (40 mg total) by mouth daily with breakfast for 5 days.    Dispense:  10 tablet  Refill:  0    Order Specific Question:   Supervising Provider    Answer:   Danise Edge A [[id number]]    Return if symptoms worsen or fail to improve.  Clayborne Dana, NP

## 2023-08-09 ENCOUNTER — Encounter (INDEPENDENT_AMBULATORY_CARE_PROVIDER_SITE_OTHER): Payer: Self-pay | Admitting: Otolaryngology

## 2023-08-29 ENCOUNTER — Institutional Professional Consult (permissible substitution) (INDEPENDENT_AMBULATORY_CARE_PROVIDER_SITE_OTHER): Payer: Managed Care, Other (non HMO)

## 2023-09-19 ENCOUNTER — Telehealth (INDEPENDENT_AMBULATORY_CARE_PROVIDER_SITE_OTHER): Payer: Self-pay | Admitting: Physician Assistant

## 2023-09-19 NOTE — Telephone Encounter (Signed)
Called and left voicemail for patient to schedule audiogram before or after appointment with Burna Forts on 1/6.

## 2023-09-30 ENCOUNTER — Telehealth (INDEPENDENT_AMBULATORY_CARE_PROVIDER_SITE_OTHER): Payer: Self-pay | Admitting: Physician Assistant

## 2023-09-30 NOTE — Telephone Encounter (Signed)
 Called patient to confirm appt. Left Voicemail regarding the info below.  10/03/2023 Status: Sch  Time: 10:30 AM [id number] [place], [place] [address]

## 2023-10-03 ENCOUNTER — Ambulatory Visit (INDEPENDENT_AMBULATORY_CARE_PROVIDER_SITE_OTHER): Payer: Self-pay | Admitting: Audiology

## 2023-10-03 ENCOUNTER — Ambulatory Visit (INDEPENDENT_AMBULATORY_CARE_PROVIDER_SITE_OTHER): Payer: Commercial Managed Care - HMO | Admitting: Physician Assistant

## 2023-10-03 DIAGNOSIS — H6593 Unspecified nonsuppurative otitis media, bilateral: Secondary | ICD-10-CM

## 2023-10-03 DIAGNOSIS — H903 Sensorineural hearing loss, bilateral: Secondary | ICD-10-CM | POA: Diagnosis not present

## 2023-10-03 DIAGNOSIS — H9313 Tinnitus, bilateral: Secondary | ICD-10-CM | POA: Diagnosis not present

## 2023-10-03 NOTE — Progress Notes (Signed)
 Dear Dr. Almarie, Here is my assessment for our mutual patient, Kurt Ross. Thank you for allowing me the opportunity to care for your patient. Please do not hesitate to contact me should you have any other questions. Sincerely, Chyrl Cohen PA-C  Otolaryngology Clinic Note Referring provider: Dr. Almarie HPI:  Kurt Ross is a 58 y.o. male kindly referred by Dr. Almarie   The patient presents today with complaints of tinnitus.  The patient notes that symptoms started approximately 2 to 3 weeks ago with bilateral decreased hearing and ringing in the ears.  He notes that he saw his primary care provider and had his ears cleaned which improved his hearing but he notes he continued to have the tinnitus.  He notes the left side is worse than the right.  He notes it was intermittent, but has now become constant in the left ear.  He notes it is a high-pitched noise that is consistent, it is not rhythmic.  He denies any associated hearing loss, pain, drainage, dizziness.  No history of recurrent ear infections as a child or as an adult, no head or neck cancer or surgery.  He notes he is able to sleep at night, the symptoms are worse in quiet and improved with background noises.   H&N Surgery: no Personal or FHx of bleeding dz or anesthesia difficulty: no   GLP-1: no AP/AC: no  Tobacco: no. Alcohol: no. Occupation: retail.  Independent Review of Additional Tests or Records:   10/04/2023 Audiogram was independently reviewed and interpreted by me and it reveals Right ear: normal hearing from [phone] Hz except for a mild sensorineural hearing loss from 4000-6000 Hz,; 96% word interpretation at 55 dB; type A tympanogram Left ear: mild sensorineural hearing loss at 250 Hz, then normal hearing from [phone] Hz then mild sensorineural hearing loss from 4000-8000 Hz; 96% word interpretation at 55dB; type A tympanogram  SNHL= Sensorineural hearing loss    PMH/Meds/All/SocHx/FamHx/ROS:   Past Medical History:   Diagnosis Date   GERD (gastroesophageal reflux disease)    Hepatitis B      No past surgical history on file.  No family history on file.   Social Connections: Unknown (01/25/2022)   Received from [facility]   Social Network    Social Network: Not on file      Current Outpatient Medications:    entecavir  (BARACLUDE ) 1 MG tablet, Take 1 mg by mouth daily., Disp: , Rfl:    hydrOXYzine  (ATARAX ) 10 MG tablet, Take 1-2 tablets (10-20 mg total) by mouth at bedtime as needed for anxiety (insomnia)., Disp: 30 tablet, Rfl: 2   triamcinolone  (NASACORT ) 55 MCG/ACT AERO nasal inhaler, Place 2 sprays into the nose daily., Disp: 1 each, Rfl: 12   Physical Exam:   There were no vitals taken for this visit.  Pertinent Findings   CN II-XII intact Bilateral EAC clear and TM intact with well pneumatized middle ear spaces Weber 512: right Rinne 512: AC > BC b/l  Anterior rhinoscopy: Septum midline; bilateral inferior turbinates with no hypertrophy  No lesions of oral cavity/oropharynx; dentition WNL No obviously palpable neck masses/lymphadenopathy/thyromegaly No respiratory distress or stridor  Seprately Identifiable Procedures:  None  Impression & Plans:  Kurt Ross is a 58 y.o. male with the following   Tinnitus-  The patient presents today with bilateral tinnitus, left greater than right.  No alarming findings on physical exam or history.  Low suspicion for vascular etiology.  Audiogram performed showing some mild sensorineural  hearing loss but no singicant difference between ears. Less than 10 db air/bone difference on the right. I would like the patient to attempt Flonase, he notes that he prefers Nasacort  as he previously used this and could not tolerate Flonase.  He will use Nasacort  for the next 3 months, continue to attempt to aerate his ears, and follow-up sooner as needed.  I will reach out to our front office and have them schedule a visit with me in 3 months.   I have also offered resources for him to be seen at the tinnitus clinic at [facility], he will consider that option as well.  He had no further questions or concerns at today's visit.     - f/u 3 months   Thank you for allowing me the opportunity to care for your patient. Please do not hesitate to contact me should you have any other questions.  Sincerely, Chyrl Cohen PA-C [facility] Phone: [phone] Fax: [phone]  10/03/2023, 10:40 AM

## 2023-10-03 NOTE — Progress Notes (Signed)
  [address], [address] [place], [place] [address] [phone]  Audiological Evaluation    Name: Kurt Ross     DOB:   09/09/1966      MRN:   [id number]                                                                                     Service Date: 10/03/2023     Accompanied by: unaccompanied   Patient comes today after Reyes Cohen, PA-C sent a referral for a hearing evaluation due to concerns with hearing loss.   Symptoms Yes Details  Hearing loss  []     Tinnitus  [x]  Left sided crickets or fluid like sensation with onset 3 weeks ago.  Ear pain/ Ear infections  []     Balance problems  []    Noise exposure history  []  denied  Previous ear surgeries  []    Family history  []    Amplification  []    Other  []      Otoscopy: Right ear: Clear external ear canals and notable landmarks visualized on the tympanic membrane. Left ear:  Clear external ear canals and notable landmarks visualized on the tympanic membrane.  Tympanometry: Right ear: Type A- Normal external ear canal volume with normal middle ear pressure and tympanic membrane compliance Left ear: Type A- Normal external ear canal volume with normal middle ear pressure and tympanic membrane compliance   Pure tone Audiometry: Right ear- Normal hearing from [phone] Hz, except for a mild sensorineural hearing loss from 4000-6000 Hz.   Left ear-  Mild sensorineural hearing loss at 250 Hz, then normal hearing from [phone], then mild sensorineural hearing loss from 4000-8000 Hz.    The hearing test results were completed under headphones (inserts for [facility] masking) and results are deemed to be of good reliability. Test technique:  conventional     Speech Audiometry: Right ear- Speech Reception Threshold (SRT) was obtained at 15 dBHL Left ear-Speech Reception Threshold (SRT) was obtained at 20 dBHL   Word Recognition Score Tested using NU-6 (MLV) Right ear: 96% was obtained at a presentation level of 55 dBHL with  contralateral masking which is deemed as  excellent Left ear: 96% was obtained at a presentation level of 55 dBHL with contralateral masking which is deemed as  excellent    Recommendations: Follow up with ENT as scheduled for today. Return for a hearing evaluation if concerns with hearing changes arise or per MD recommendation. Use hearing protection when exposed to loud/damaging sounds.  Consider various tinnitus strategies, including the use of a sound generator, hearing aids, and/or tinnitus retraining therapy. Patient may consider visiting the Tinnitus Clinic at [facility].   Kurt Hoffmeier MARIE LEROUX-MARTINEZ, AUD

## 2023-10-06 ENCOUNTER — Encounter: Payer: Self-pay | Admitting: Audiology

## 2023-10-06 ENCOUNTER — Encounter (INDEPENDENT_AMBULATORY_CARE_PROVIDER_SITE_OTHER): Payer: Self-pay | Admitting: Physician Assistant

## 2023-10-06 MED ORDER — TRIAMCINOLONE ACETONIDE 55 MCG/ACT NA AERO: 2.0000 | INHALATION_SPRAY | Freq: Every day | NASAL | 12 refills | Status: DC

## 2023-11-02 ENCOUNTER — Ambulatory Visit: Payer: Self-pay | Admitting: Family Medicine

## 2023-11-02 NOTE — Telephone Encounter (Signed)
 Copied from CRM [id number]. Topic: Clinical - Red Word Triage >> Nov 02, 2023  2:08 PM Robinson H wrote: [facility] that prompted transfer to Nurse Triage: Patient has a cough that's getting worse, worse in the morning when he wakes up.  Chief Complaint: Dry cough Symptoms: Dry cough Frequency: Yesterday Pertinent Negatives: Patient denies fever Disposition: [] ED /[] Urgent Care (no appt availability in office) / [] Appointment(In office/virtual)/ []  [facility] [place] [facility]/ [x] Home Care/ [] Refused Recommended Disposition /[] [facility]/ []  Follow-up with PCP Additional Notes: Patient called in to report a dry cough that started yesterday. Patient stated the cough is nonproductive and denied coughing up blood. Patient denied difficulty breathing. Patient denied coughing spells. Patient able to speak in full and complete sentences while on the phone with this RN. No audible work of breathing detected while on the phone with this RN. Patient stated he had a runny nose a few days ago that has since gone away. Patient denied fever and other cold/flu symptoms at this time. Patient stated that he has not taken anything for the cough. This RN advised home care at this time. This RN advised the patient to try taking Robitussin and cough drops as directed for the cough. This RN advised the patient to call back if the cough persists for a few days after starting home care remedies. This RN advised patient to call back if symptoms worsen. Patient complied.   Reason for Disposition  Cough  Answer Assessment - Initial Assessment Questions 1. ONSET: When did the cough begin?      Cough started yesterday, other symptoms started 3 days ago 2. SEVERITY: How bad is the cough today?      States cough is still present today, dry and nagging 3. SPUTUM: Describe the color of your sputum (none, dry cough; clear, white, yellow, green)     Denies 4. HEMOPTYSIS: Are you coughing up any blood? If so ask: How  much? (flecks, streaks, tablespoons, etc.)     Denies 5. DIFFICULTY BREATHING: Are you having difficulty breathing? If Yes, ask: How bad is it? (e.g., mild, moderate, severe)    - MILD: No SOB at rest, mild SOB with walking, speaks normally in sentences, can lie down, no retractions, pulse < 100.    - MODERATE: SOB at rest, SOB with minimal exertion and prefers to sit, cannot lie down flat, speaks in phrases, mild retractions, audible wheezing, pulse 100-120.    - SEVERE: Very SOB at rest, speaks in single words, struggling to breathe, sitting hunched forward, retractions, pulse > 120      Denies 6. FEVER: Do you have a fever? If Yes, ask: What is your temperature, how was it measured, and when did it start?     Denies 7. CARDIAC HISTORY: Do you have any history of heart disease? (e.g., heart attack, congestive heart failure)      Denies 8. LUNG HISTORY: Do you have any history of lung disease?  (e.g., pulmonary embolus, asthma, emphysema)     Denies 10. OTHER SYMPTOMS: Do you have any other symptoms? (e.g., runny nose, wheezing, chest pain)     Runny nose that has gone away, denies other symptoms at this time  Protocols used: Cough - Acute Non-Productive-A-AH

## 2023-11-02 NOTE — Telephone Encounter (Signed)
 Noted

## 2023-11-19 ENCOUNTER — Telehealth: Payer: Self-pay | Admitting: Hematology and Oncology

## 2023-11-19 NOTE — Telephone Encounter (Signed)
 Called patient unable to leave vm regarding upcoming appointment. Sent sms message to return call

## 2023-12-01 ENCOUNTER — Ambulatory Visit: Payer: Self-pay | Admitting: Hematology and Oncology

## 2023-12-02 ENCOUNTER — Ambulatory Visit: Payer: Commercial Managed Care - HMO | Admitting: Hematology and Oncology

## 2024-01-02 ENCOUNTER — Ambulatory Visit (INDEPENDENT_AMBULATORY_CARE_PROVIDER_SITE_OTHER): Payer: Commercial Managed Care - HMO

## 2024-01-26 DIAGNOSIS — K7460 Unspecified cirrhosis of liver: Secondary | ICD-10-CM | POA: Diagnosis not present

## 2024-02-28 DIAGNOSIS — N281 Cyst of kidney, acquired: Secondary | ICD-10-CM | POA: Diagnosis not present

## 2024-02-28 DIAGNOSIS — D1803 Hemangioma of intra-abdominal structures: Secondary | ICD-10-CM | POA: Diagnosis not present

## 2024-02-28 DIAGNOSIS — K7460 Unspecified cirrhosis of liver: Secondary | ICD-10-CM | POA: Diagnosis not present

## 2024-04-11 ENCOUNTER — Ambulatory Visit: Payer: Self-pay | Admitting: Family Medicine

## 2024-04-23 ENCOUNTER — Encounter: Payer: Self-pay | Admitting: Family Medicine

## 2024-04-23 ENCOUNTER — Ambulatory Visit (INDEPENDENT_AMBULATORY_CARE_PROVIDER_SITE_OTHER): Admitting: Family Medicine

## 2024-04-23 VITALS — BP 135/74 | HR 69 | Ht 73.0 in | Wt 219.0 lb

## 2024-04-23 DIAGNOSIS — Z125 Encounter for screening for malignant neoplasm of prostate: Secondary | ICD-10-CM | POA: Diagnosis not present

## 2024-04-23 DIAGNOSIS — Z0000 Encounter for general adult medical examination without abnormal findings: Secondary | ICD-10-CM | POA: Diagnosis not present

## 2024-04-23 DIAGNOSIS — Z13220 Encounter for screening for lipoid disorders: Secondary | ICD-10-CM

## 2024-04-23 NOTE — Progress Notes (Signed)
 Complete physical exam  Patient: Kurt Ross   DOB: September 26, 1966   58 y.o. Male  MRN: [id number]  Subjective:    Chief Complaint  Patient presents with   Annual Exam    Kurt Ross is a 58 y.o. male who presents today for a complete physical exam. He reports consuming a general diet. Home exercise routine includes walking. He generally feels well. He reports sleeping well. He does not have additional problems to discuss today.   Currently lives with: wife, children Acute concerns or interim problems since last visit: no  Vision concerns: no Dental concerns: no STD concerns: no  ETOH use: no Nicotine use: no Recreational drugs/illegal substances: no     Most recent fall risk assessment:    11/16/2022   10:27 AM  Fall Risk   Falls in the past year? 0  Number falls in past yr: 0  Injury with Fall? 0  Risk for fall due to : No Fall Risks  Follow up Falls evaluation completed     Most recent depression screenings:    11/16/2022   10:27 AM 08/16/2022    1:17 PM  PHQ 2/9 Scores  PHQ - 2 Score 0 0            Patient Care Team: Kurt Waddell NOVAK, NP as PCP - General (Family Medicine)   Outpatient Medications Prior to Visit  Medication Sig   entecavir  (BARACLUDE ) 1 MG tablet Take 1 mg by mouth daily.   pantoprazole  (PROTONIX ) 40 MG tablet Take 40 mg by mouth daily as needed.   [DISCONTINUED] hydrOXYzine  (ATARAX ) 10 MG tablet Take 1-2 tablets (10-20 mg total) by mouth at bedtime as needed for anxiety (insomnia).   [DISCONTINUED] triamcinolone  (NASACORT ) 55 MCG/ACT AERO nasal inhaler Place 2 sprays into the nose daily.   No facility-administered medications prior to visit.    ROS All review of systems negative except what is listed in the HPI        Objective:     BP 135/74   Pulse 69   Ht 6' 1 (1.854 m)   Wt 219 lb (99.3 kg)   SpO2 97%   BMI 28.89 kg/m    Physical Exam Vitals reviewed.  Constitutional:      General: He is not in acute  distress.    Appearance: Normal appearance. He is not ill-appearing.  HENT:     Head: Normocephalic and atraumatic.     Right Ear: Tympanic membrane normal.     Left Ear: Tympanic membrane normal.     Nose: Nose normal.     Mouth/Throat:     Mouth: Mucous membranes are moist.     Pharynx: Oropharynx is clear.  Eyes:     Extraocular Movements: Extraocular movements intact.     Conjunctiva/sclera: Conjunctivae normal.     Pupils: Pupils are equal, round, and reactive to light.  Cardiovascular:     Rate and Rhythm: Normal rate and regular rhythm.     Pulses: Normal pulses.     Heart sounds: Normal heart sounds.  Pulmonary:     Effort: Pulmonary effort is normal.     Breath sounds: Normal breath sounds.  Abdominal:     General: Abdomen is flat. Bowel sounds are normal. There is no distension.     Palpations: Abdomen is soft. There is no mass.     Tenderness: There is no abdominal tenderness. There is no right CVA tenderness, left CVA tenderness, guarding or rebound.  Genitourinary:  Comments: Deferred exam Musculoskeletal:        General: Normal range of motion.     Cervical back: Normal range of motion and neck supple. No tenderness.     Right lower leg: No edema.     Left lower leg: No edema.  Lymphadenopathy:     Cervical: No cervical adenopathy.  Skin:    General: Skin is warm and dry.     Capillary Refill: Capillary refill takes less than 2 seconds.  Neurological:     General: No focal deficit present.     Mental Status: He is alert and oriented to person, place, and time. Mental status is at baseline.  Psychiatric:        Mood and Affect: Mood normal.        Behavior: Behavior normal.        Thought Content: Thought content normal.        Judgment: Judgment normal.      No results found for any visits on 04/23/24.     Assessment & Plan:    Routine Health Maintenance and Physical Exam Discussed health promotion and safety including diet and exercise  recommendations, dental health, and injury prevention. Tobacco cessation if applicable. Seat belts, sunscreen, smoke detectors, etc.    Immunization History  Administered Date(s) Administered   Hepatitis A, Adult 04/24/2014, 12/24/2014   Influenza-Unspecified 07/23/2022   Moderna Sars-Covid-2 Vaccination 10/09/2020   PFIZER(Purple Top)SARS-COV-2 Vaccination 01/04/2020, 02/01/2020   Tdap 07/17/2022   Zoster Recombinant(Shingrix) 09/23/2022, 11/24/2022    Health Maintenance  Topic Date Due   Pneumococcal Vaccine 70-67 Years old (1 of 2 - PCV) Never done   COVID-19 Vaccine (4 - 2024-25 season) 07/11/2024 (Originally 05/29/2023)   Hepatitis B Vaccines (1 of 3 - Risk 3-dose series) 04/23/2025 (Originally 03/30/2024)   INFLUENZA VACCINE  04/27/2024   Colonoscopy  01/01/2030   DTaP/Tdap/Td (2 - Td or Tdap) 07/17/2032   Hepatitis C Screening  Completed   HIV Screening  Completed   Zoster Vaccines- Shingrix  Completed   HPV VACCINES  Aged Out   Meningococcal B Vaccine  Aged Out        Problem List Items Addressed This Visit   None Visit Diagnoses       Annual physical exam    -  Primary   Relevant Orders   CBC with Differential/Platelet   Comprehensive metabolic panel with GFR   Lipid panel   TSH   PSA     Screening for prostate cancer       Relevant Orders   PSA          PATIENT COUNSELING:     Recommend that most people either abstain from alcohol or drink within safe limits (<=14/week and <=4 drinks/occasion for males, <=7/weeks and <= 3 drinks/occasion for females) and that the risk for alcohol disorders and other health effects rises proportionally with the number of drinks per week and how often a drinker exceeds daily limits.   Diet: Recommend to adjust caloric intake to maintain or achieve ideal body weight, to reduce intake of dietary saturated fat and total fat, to limit sodium intake by avoiding high sodium foods and not adding table salt, and to maintain  adequate dietary potassium and calcium preferably from fresh fruits, vegetables, and low-fat dairy products.   Emphasized the importance of regular exercise.  Injury prevention: Recommend seatbelts, safety helmets, smoke detector, etc..   Dental health: Recommend regular tooth brushing, flossing, and dental visits.  Return in about 1 year (around 04/23/2025) for physical.     Waddell KATHEE Mon, NP

## 2024-04-24 ENCOUNTER — Ambulatory Visit: Payer: Self-pay | Admitting: Family Medicine

## 2024-04-24 LAB — COMPREHENSIVE METABOLIC PANEL WITH GFR
ALT: 17 U/L (ref 0–53)
AST: 19 U/L (ref 0–37)
Albumin: 4.5 g/dL (ref 3.5–5.2)
Alkaline Phosphatase: 56 U/L (ref 39–117)
BUN: 12 mg/dL (ref 6–23)
CO2: 27 meq/L (ref 19–32)
Calcium: 9.1 mg/dL (ref 8.4–10.5)
Chloride: 102 meq/L (ref 96–112)
Creatinine, Ser: 1.14 mg/dL (ref 0.40–1.50)
GFR: 70.12 mL/min (ref >60.00)
Glucose, Bld: 103 mg/dL — ABNORMAL HIGH (ref 70–99)
Potassium: 3.8 meq/L (ref 3.5–5.1)
Sodium: 140 meq/L (ref 135–145)
Total Bilirubin: 0.5 mg/dL (ref 0.2–1.2)
Total Protein: 7.4 g/dL (ref 6.0–8.3)

## 2024-04-24 LAB — LIPID PANEL
Cholesterol: 169 mg/dL (ref 0–200)
HDL: 28.2 mg/dL — ABNORMAL LOW (ref >39.00)
LDL Cholesterol: 100 mg/dL — ABNORMAL HIGH (ref 0–99)
NonHDL: 141.09
Total CHOL/HDL Ratio: 6
Triglycerides: 206 mg/dL — ABNORMAL HIGH (ref 0.0–149.0)
VLDL: 41.2 mg/dL — ABNORMAL HIGH (ref 0.0–40.0)

## 2024-04-24 LAB — CBC WITH DIFFERENTIAL/PLATELET
Basophils Absolute: 0 K/uL (ref 0.0–0.1)
Basophils Relative: 1.2 % (ref 0.0–3.0)
Eosinophils Absolute: 0.1 K/uL (ref 0.0–0.7)
Eosinophils Relative: 1.6 % (ref 0.0–5.0)
HCT: 45.9 % (ref 39.0–52.0)
Hemoglobin: 15.4 g/dL (ref 13.0–17.0)
Lymphocytes Relative: 28.5 % (ref 12.0–46.0)
Lymphs Abs: 1.1 K/uL (ref 0.7–4.0)
MCHC: 33.6 g/dL (ref 30.0–36.0)
MCV: 87.2 fl (ref 78.0–100.0)
Monocytes Absolute: 0.5 K/uL (ref 0.1–1.0)
Monocytes Relative: 14.1 % — ABNORMAL HIGH (ref 3.0–12.0)
Neutro Abs: 2.1 K/uL (ref 1.4–7.7)
Neutrophils Relative %: 54.6 % (ref 43.0–77.0)
Platelets: 203 K/uL (ref 150.0–400.0)
RBC: 5.26 Mil/uL (ref 4.22–5.81)
RDW: 14 % (ref 11.5–15.5)
WBC: 3.9 K/uL — ABNORMAL LOW (ref 4.0–10.5)

## 2024-04-24 LAB — TSH: TSH: 1.02 u[IU]/mL (ref 0.35–5.50)

## 2024-04-24 LAB — PSA: PSA: 0.52 ng/mL (ref 0.10–4.00)

## 2024-04-25 ENCOUNTER — Telehealth: Payer: Self-pay | Admitting: Neurology

## 2024-04-25 DIAGNOSIS — E785 Hyperlipidemia, unspecified: Secondary | ICD-10-CM

## 2024-04-25 NOTE — Telephone Encounter (Signed)
 Pt called back and I relayed info. He asked for you to call him back to discuss what specific mg he can take of the aspirin before his test.

## 2024-04-25 NOTE — Telephone Encounter (Signed)
 Patient made aware no need to take aspirin before the test. He expressed understanding.

## 2024-04-25 NOTE — Telephone Encounter (Signed)
 Copied from CRM [id number]. Topic: Clinical - Medication Question >> Apr 25, 2024  9:45 AM Frederich PARAS wrote: Reason for CRM: PT Calling, he wants to take CT Coronary Calcium test,& he wants to  try to to take it asap and he wants to know if he can take aspirin or any medication  before the test. If possible can provider please give pt a call  [phone]

## 2024-04-25 NOTE — Telephone Encounter (Signed)
 Pended order, please advise on medication.

## 2024-04-25 NOTE — Telephone Encounter (Signed)
 Called patient, LVM letting him know can take all normal medications before testing. Ordered and they will call him to schedule.

## 2024-05-01 ENCOUNTER — Telehealth: Payer: Self-pay | Admitting: Family Medicine

## 2024-05-01 NOTE — Telephone Encounter (Signed)
 Copied from CRM #[id number]. Topic: General - Call Back - No Documentation >> May 01, 2024 12:34 PM Leah C wrote: Reason for CRM: Patient is calling back and is stating that he has not yet received a phone call to be scheduled for the  CT Coronary Calcium test.   [phone] (M)

## 2024-05-01 NOTE — Telephone Encounter (Signed)
 Called patient and given number to imaging to call and schedule.

## 2024-05-03 ENCOUNTER — Ambulatory Visit (HOSPITAL_BASED_OUTPATIENT_CLINIC_OR_DEPARTMENT_OTHER)
Admission: RE | Admit: 2024-05-03 | Discharge: 2024-05-03 | Disposition: A | Discharge status: Home / Self Care | Payer: Self-pay | Source: Ambulatory Visit | Attending: Family Medicine | Admitting: Family Medicine

## 2024-05-03 DIAGNOSIS — E785 Hyperlipidemia, unspecified: Secondary | ICD-10-CM | POA: Insufficient documentation

## 2024-05-07 NOTE — Telephone Encounter (Signed)
 Copied from CRM [id number]. Topic: Clinical - Lab/Test Results >> May 07, 2024  4:08 PM Thersia BROCKS wrote: Reason for CRM: Patient called in would like for someone to give a callback regarding ct results

## 2024-05-08 ENCOUNTER — Ambulatory Visit: Payer: Self-pay | Admitting: Family Medicine

## 2024-05-08 NOTE — Telephone Encounter (Signed)
 Will contact patient once this is reviewed by provider.

## 2024-05-11 ENCOUNTER — Telehealth: Payer: Self-pay | Admitting: Neurology

## 2024-05-11 DIAGNOSIS — E782 Mixed hyperlipidemia: Secondary | ICD-10-CM

## 2024-05-11 NOTE — Telephone Encounter (Signed)
 Copied from CRM #[id number]. Topic: General - Other >> May 11, 2024  3:27 PM Martinique E wrote: Reason for CRM: Patient called in stating that he spoke with his gastroenterology provider and they relayed that it was okay for patient to be prescribed a statin medication, patient stated he recently spoke about this with his PCP.

## 2024-05-14 MED ORDER — ROSUVASTATIN CALCIUM 5 MG PO TABS: 5.0000 mg | ORAL_TABLET | Freq: Every day | ORAL | 3 refills | Status: AC

## 2024-05-14 NOTE — Addendum Note (Signed)
 Addended by: ALMARIE BIRMINGHAM B on: 05/14/2024 08:01 AM   Modules accepted: Orders

## 2024-05-14 NOTE — Telephone Encounter (Signed)
 LVM letting patient know RX sent with to follow up in 3 months. TO call with questions.

## 2024-05-16 NOTE — Telephone Encounter (Signed)
 Called and LVM letting patient know we would monitor this, as discussed, and recheck labs in 3 months. He is to call and schedule 3 month appt.

## 2024-05-16 NOTE — Telephone Encounter (Signed)
 Copied from CRM #[id number]. Topic: Clinical - Medication Question >> May 16, 2024  1:29 PM [place] A wrote: Reason for CRM: Patient would like to know Crestor  5MG  does not it still need to be monitored and who does the the monitoring? Please contact

## 2024-07-24 ENCOUNTER — Ambulatory Visit: Admitting: Family Medicine

## 2024-07-24 ENCOUNTER — Encounter: Payer: Self-pay | Admitting: Family Medicine

## 2024-07-24 ENCOUNTER — Ambulatory Visit (INDEPENDENT_AMBULATORY_CARE_PROVIDER_SITE_OTHER): Admitting: Family Medicine

## 2024-07-24 ENCOUNTER — Ambulatory Visit (HOSPITAL_BASED_OUTPATIENT_CLINIC_OR_DEPARTMENT_OTHER)
Admission: RE | Admit: 2024-07-24 | Discharge: 2024-07-24 | Disposition: A | Discharge status: Home / Self Care | Source: Ambulatory Visit | Attending: Family Medicine | Admitting: Family Medicine

## 2024-07-24 VITALS — BP 134/67 | HR 73 | Ht 73.0 in | Wt 216.0 lb

## 2024-07-24 DIAGNOSIS — N5082 Scrotal pain: Secondary | ICD-10-CM

## 2024-07-24 DIAGNOSIS — E785 Hyperlipidemia, unspecified: Secondary | ICD-10-CM

## 2024-07-24 DIAGNOSIS — B181 Chronic viral hepatitis B without delta-agent: Secondary | ICD-10-CM

## 2024-07-24 NOTE — Progress Notes (Signed)
 Established Patient Office Visit  Patient: Kurt Ross   DOB: Mar 22, 1966   58 y.o. Male  MRN: [id number]  Subjective:    Chief Complaint  Patient presents with   Medical Management of Chronic Issues    Kurt Ross is a 58 y.o. male who presents today for chronic disease management.  Discussed the use of AI scribe software for clinical note transcription with the patient, who gave verbal consent to proceed.  History of Present Illness Kurt Ross is a 58 year old male who presents for a three-month follow-up after starting cholesterol medication.  He has experienced no symptoms or side effects since starting the cholesterol medication and feels good overall. His current medications include cholesterol medicine, hep B medicine, and reflux medicine.  He mentions experiencing pain in the genital area, localized to the left side of scrotum, associated with not ejaculating after stimulation, and resolving after ejaculation. No swelling, rashes, or burning during urination are reported. He experienced some discomfort yesterday, which has since resolved. States this has happened 20-30 times.   He reports occasional chest itching. No current chest pain or discomfort.  He tries to eat healthily and used to walk in the neighborhood for exercise, although he has not been exercising recently due to the cold weather.    Hypertension: - Medications: none, lifestyle only - Compliance: good - Checking BP at home: no - Denies any SOB, recurrent headaches, CP, vision changes, LE edema, dizziness, palpitations, or medication side effects. 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   GERD:  - medications: Protonix  40 mg prn              Patient Care Team: Almarie Waddell NOVAK, NP as PCP - General (Family Medicine)   Outpatient Medications Prior to Visit  Medication Sig   entecavir  (BARACLUDE ) 1 MG tablet Take 1 mg by mouth daily.   pantoprazole  (PROTONIX ) 40 MG tablet Take 40 mg by mouth daily as needed.   rosuvastatin  (CRESTOR ) 5 MG tablet Take 1 tablet (5 mg total) by mouth daily.   No facility-administered medications prior to visit.    ROS All review of systems negative except what is listed in the HPI        Objective:     BP 134/67   Pulse 73   Ht 6' 1 (1.854 m)   Wt 216 lb (98 kg)   SpO2 100%   BMI 28.50 kg/m     Physical Exam Vitals reviewed. Exam conducted with a chaperone present.  Constitutional:      Appearance: Normal appearance.  Cardiovascular:     Rate and Rhythm: Normal rate and regular rhythm.  Pulmonary:     Effort: Pulmonary effort is normal.     Breath sounds: Normal breath sounds.  Abdominal:  Hernia: There is no hernia in the left inguinal area or right inguinal area.  Genitourinary:    Penis: Normal.      Testes:        Right: Mass, tenderness or swelling not present.        Left: Mass, tenderness or swelling not present.     Epididymis:     Right: Not inflamed or enlarged. No tenderness.     Left: Not inflamed or enlarged. No tenderness.  Musculoskeletal:        General: No tenderness.  Neurological:     Mental Status: He is alert and oriented to person, place, and time.  Psychiatric:        Mood and Affect: Mood normal.        Behavior: Behavior normal.        Thought Content: Thought content normal.        Judgment: Judgment normal.      No  results found for any visits on 07/24/24.     Assessment & Plan:     Problem List Items Addressed This Visit       Active Problems   Chronic viral hepatitis B without delta-agent (HCC)   Relevant Orders   Comprehensive metabolic panel with GFR   Lipid panel   Other Visit Diagnoses       Hyperlipidemia, unspecified hyperlipidemia type    -  Primary   Relevant Orders   Comprehensive metabolic panel with GFR   Lipid panel     Scrotal pain       Relevant Orders   US  SCROTUM W/DOPPLER   Ambulatory referral to Urology       Assessment & Plan Left testicular pain, intermittent Intermittent left testicular pain, resolving with ejaculation. - Order scrotal ultrasound. - Refer to urologist for further evaluation.  Hyperlipidemia Currently on cholesterol medication with no reported side effects. Approval from liver specialist obtained prior to starting medication. - Recheck blood work for cholesterol levels.      Return if symptoms worsen or fail to improve, for / next CPE due summer 2026.     Waddell KATHEE Mon, NP  I,Emily Lagle,acting as a scribe for Waddell KATHEE Mon, NP.,have documented all relevant documentation on the behalf of Waddell KATHEE Mon, NP,as directed by  Waddell KATHEE Mon, NP while in the presence of Waddell KATHEE Mon, NP.   I, Waddell KATHEE Mon, NP, have reviewed all documentation for this visit. The documentation on 07/24/2024 for the exam, diagnosis, procedures, and orders are all accurate and complete.

## 2024-07-25 ENCOUNTER — Ambulatory Visit: Payer: Self-pay | Admitting: Family Medicine

## 2024-07-25 LAB — LIPID PANEL
Cholesterol: 118 mg/dL (ref 0–200)
HDL: 29.5 mg/dL — ABNORMAL LOW (ref >39.00)
LDL Cholesterol: 69 mg/dL (ref 0–99)
NonHDL: 88.15
Total CHOL/HDL Ratio: 4
Triglycerides: 96 mg/dL (ref 0.0–149.0)
VLDL: 19.2 mg/dL (ref 0.0–40.0)

## 2024-07-25 LAB — COMPREHENSIVE METABOLIC PANEL WITH GFR
ALT: 18 U/L (ref 0–53)
AST: 19 U/L (ref 0–37)
Albumin: 4.4 g/dL (ref 3.5–5.2)
Alkaline Phosphatase: 56 U/L (ref 39–117)
BUN: 11 mg/dL (ref 6–23)
CO2: 30 meq/L (ref 19–32)
Calcium: 9.2 mg/dL (ref 8.4–10.5)
Chloride: 102 meq/L (ref 96–112)
Creatinine, Ser: 0.88 mg/dL (ref 0.40–1.50)
GFR: 93.59 mL/min (ref >60.00)
Glucose, Bld: 100 mg/dL — ABNORMAL HIGH (ref 70–99)
Potassium: 3.8 meq/L (ref 3.5–5.1)
Sodium: 140 meq/L (ref 135–145)
Total Bilirubin: 0.5 mg/dL (ref 0.2–1.2)
Total Protein: 7.5 g/dL (ref 6.0–8.3)

## 2024-08-07 ENCOUNTER — Ambulatory Visit: Admitting: Family Medicine

## 2024-08-26 NOTE — Progress Notes (Deleted)
    Chief Complaint: No chief complaint on file.   History of Present Illness:  Kurt Ross is a 58 y.o. male who is seen in consultation from Almarie Waddell NOVAK, NP for evaluation of ***.   Past Medical History:  Past Medical History:  Diagnosis Date   GERD (gastroesophageal reflux disease)    Hepatitis B     Past Surgical History:  No past surgical history on file.  Allergies:  Allergies  Allergen Reactions   Famotidine Diarrhea and Other (See Comments)   Codeine Nausea And Vomiting   Nsaids    Penicillins Rash    Family History:  No family history on file.  Social History:  Social History   Tobacco Use   Smoking status: Never   Smokeless tobacco: Never  Substance Use Topics   Alcohol use: No   Drug use: No    Review of symptoms:  Constitutional:  Negative for unexplained weight loss, night sweats, fever, chills ENT:  Negative for nose bleeds, sinus pain, painful swallowing CV:  Negative for chest pain, shortness of breath, exercise intolerance, palpitations, loss of consciousness Resp:  Negative for cough, wheezing, shortness of breath GI:  Negative for nausea, vomiting, diarrhea, bloody stools GU:  Positives noted in HPI; otherwise negative for gross hematuria, dysuria, urinary incontinence Neuro:  Negative for seizures, poor balance, limb weakness, slurred speech Psych:  Negative for lack of energy, depression, anxiety Endocrine:  Negative for polydipsia, polyuria, symptoms of hypoglycemia (dizziness, hunger, sweating) Hematologic:  Negative for anemia, purpura, petechia, prolonged or excessive bleeding, use of anticoagulants  Allergic:  Negative for difficulty breathing or choking as a result of exposure to anything; no shellfish allergy; no allergic response (rash/itch) to materials, foods  Physical exam: There were no vitals taken for this visit. GENERAL APPEARANCE:  Well appearing, well developed, well nourished, NAD HEENT: Atraumatic,  Normocephalic. NECK: Normal appearance LUNGS: Normal inspiratory and expiratory excursion HEART: Regular Rate ABDOMEN: ***. GU: Phallus normal, no lesions. Scrotal skin normal. Testicles/epididymal structures normal. Meatus normal. Normal anal sphincter tone, prostate ***mL, symmetric, non nodular, non tender. EXTREMITIES: Moves all extremities well.  Without clubbing, cyanosis, or edema. NEUROLOGIC:  Alert and oriented x 3, normal gait, CN II-XII grossly intact.  MENTAL STATUS:  Appropriate. SKIN:  Warm, dry and intact.    Results:  I have reviewed referring/prior physicians notes  I have reviewed urinalysis  I have reviewed PSA results--recent PSA 0.52  I have reviewed prior imaging--scrotal U/S images reviewed-  1. Large right hydrocele and small left hydrocele. 2. Left varicocele. 3. Small simple epididymal head cysts bilaterally. 4. Small simple punctate calcifications in the left testicle.    Assessment: ***   Plan: ***

## 2024-08-27 ENCOUNTER — Ambulatory Visit: Admitting: Urology

## 2024-08-27 DIAGNOSIS — N433 Hydrocele, unspecified: Secondary | ICD-10-CM

## 2024-09-26 NOTE — Progress Notes (Deleted)
" ° ° °  Chief Complaint:   History of Present Illness:  .   Past Medical History:  Past Medical History:  Diagnosis Date   GERD (gastroesophageal reflux disease)    Hepatitis B     Past Surgical History:  No past surgical history on file.  Allergies:  Allergies  Allergen Reactions   Famotidine Diarrhea and Other (See Comments)   Codeine Nausea And Vomiting   Nsaids    Penicillins Rash    Family History:  No family history on file.  Social History:  Social History   Tobacco Use   Smoking status: Never   Smokeless tobacco: Never  Substance Use Topics   Alcohol use: No   Drug use: No    Review of symptoms:  Constitutional:  Negative for unexplained weight loss, night sweats, fever, chills ENT:  Negative for nose bleeds, sinus pain, painful swallowing CV:  Negative for chest pain, shortness of breath, exercise intolerance, palpitations, loss of consciousness Resp:  Negative for cough, wheezing, shortness of breath GI:  Negative for nausea, vomiting, diarrhea, bloody stools GU:  Positives noted in HPI; otherwise negative for gross hematuria, dysuria, urinary incontinence Neuro:  Negative for seizures, poor balance, limb weakness, slurred speech Psych:  Negative for lack of energy, depression, anxiety Endocrine:  Negative for polydipsia, polyuria, symptoms of hypoglycemia (dizziness, hunger, sweating) Hematologic:  Negative for anemia, purpura, petechia, prolonged or excessive bleeding, use of anticoagulants  Allergic:  Negative for difficulty breathing or choking as a result of exposure to anything; no shellfish allergy; no allergic response (rash/itch) to materials, foods  Physical exam: There were no vitals taken for this visit. GENERAL APPEARANCE:  Well appearing, well developed, well nourished, NAD HEENT: Atraumatic, Normocephalic. NECK: Normal appearance LUNGS: Normal inspiratory and expiratory excursion HEART: Regular Rate ABDOMEN: ***. GU: Phallus normal,  no lesions. Scrotal skin normal. Testicles/epididymal structures normal. Meatus normal. Normal anal sphincter tone, prostate ***mL, symmetric, non nodular, non tender. EXTREMITIES: Moves all extremities well.  Without clubbing, cyanosis, or edema. NEUROLOGIC:  Alert and oriented x 3, normal gait, CN II-XII grossly intact.  MENTAL STATUS:  Appropriate. SKIN:  Warm, dry and intact.    Results:  I have reviewed referring/prior physicians notes  I have reviewed urinalysis  I have reviewed PSA results--recent PSA 0.52  I have reviewed prior imaging--scrotal U/S images reviewed-  1. Large right hydrocele and small left hydrocele. 2. Left varicocele. 3. Small simple epididymal head cysts bilaterally. 4. Small simple punctate calcifications in the left testicle.    Assessment: ***   Plan: ***  "

## 2024-10-01 ENCOUNTER — Ambulatory Visit: Admitting: Urology

## 2024-10-01 DIAGNOSIS — N433 Hydrocele, unspecified: Secondary | ICD-10-CM

## 2024-10-01 DIAGNOSIS — N5082 Scrotal pain: Secondary | ICD-10-CM

## 2024-10-31 ENCOUNTER — Ambulatory Visit: Payer: Self-pay | Admitting: Urology

## 2024-10-31 VITALS — BP 149/74 | HR 72 | Ht 73.0 in | Wt 220.0 lb

## 2024-10-31 DIAGNOSIS — R1032 Left lower quadrant pain: Secondary | ICD-10-CM | POA: Diagnosis not present

## 2024-10-31 DIAGNOSIS — N433 Hydrocele, unspecified: Secondary | ICD-10-CM

## 2024-10-31 LAB — URINALYSIS, ROUTINE W REFLEX MICROSCOPIC
Bilirubin, UA: NEGATIVE
Glucose, UA: NEGATIVE
Ketones, UA: NEGATIVE
Leukocytes,UA: NEGATIVE
Nitrite, UA: NEGATIVE
Protein,UA: NEGATIVE
RBC, UA: NEGATIVE
Specific Gravity, UA: 1.015 (ref 1.005–1.030)
Urobilinogen, Ur: 0.2 mg/dL (ref 0.2–1.0)
pH, UA: 8 — ABNORMAL HIGH (ref 5.0–7.5)
